# Patient Record
Sex: Male | Born: 1980 | Race: White | Hispanic: No | Marital: Married | Smoking: Never smoker
Health system: Southern US, Community
[De-identification: ages and names within clinical notes are randomized; demographics above are authoritative.]

## PROBLEM LIST (undated history)

## (undated) DIAGNOSIS — F4310 Post-traumatic stress disorder, unspecified: Secondary | ICD-10-CM

## (undated) DIAGNOSIS — Z87898 Personal history of other specified conditions: Secondary | ICD-10-CM

## (undated) DIAGNOSIS — G5600 Carpal tunnel syndrome, unspecified upper limb: Secondary | ICD-10-CM

## (undated) DIAGNOSIS — R569 Unspecified convulsions: Secondary | ICD-10-CM

## (undated) DIAGNOSIS — F329 Major depressive disorder, single episode, unspecified: Secondary | ICD-10-CM

## (undated) DIAGNOSIS — F32A Depression, unspecified: Secondary | ICD-10-CM

## (undated) DIAGNOSIS — S6990XA Unspecified injury of unspecified wrist, hand and finger(s), initial encounter: Secondary | ICD-10-CM

## (undated) DIAGNOSIS — F1021 Alcohol dependence, in remission: Secondary | ICD-10-CM

## (undated) DIAGNOSIS — F419 Anxiety disorder, unspecified: Secondary | ICD-10-CM

## (undated) DIAGNOSIS — Z8639 Personal history of other endocrine, nutritional and metabolic disease: Secondary | ICD-10-CM

## (undated) HISTORY — PX: CARPAL TUNNEL RELEASE: SHX101

## (undated) HISTORY — DX: Unspecified injury of unspecified wrist, hand and finger(s), initial encounter: S69.90XA

## (undated) HISTORY — DX: Carpal tunnel syndrome, unspecified upper limb: G56.00

## (undated) HISTORY — DX: Personal history of other specified conditions: Z87.898

## (undated) HISTORY — DX: Anxiety disorder, unspecified: F41.9

## (undated) HISTORY — DX: Alcohol dependence, in remission: F10.21

## (undated) HISTORY — DX: Personal history of other endocrine, nutritional and metabolic disease: Z86.39

---
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
## 2004-01-08 ENCOUNTER — Ambulatory Visit: Payer: Self-pay | Admitting: Internal Medicine

## 2005-08-02 ENCOUNTER — Emergency Department (HOSPITAL_COMMUNITY): Admission: EM | Admit: 2005-08-02 | Discharge: 2005-08-02 | Payer: Self-pay | Admitting: Emergency Medicine

## 2005-08-08 ENCOUNTER — Emergency Department (HOSPITAL_COMMUNITY): Admission: EM | Admit: 2005-08-08 | Discharge: 2005-08-08 | Payer: Self-pay | Admitting: Emergency Medicine

## 2005-09-18 ENCOUNTER — Emergency Department (HOSPITAL_COMMUNITY): Admission: EM | Admit: 2005-09-18 | Discharge: 2005-09-18 | Payer: Self-pay | Admitting: Emergency Medicine

## 2005-12-24 ENCOUNTER — Ambulatory Visit: Payer: Self-pay | Admitting: Family Medicine

## 2006-02-26 ENCOUNTER — Emergency Department (HOSPITAL_COMMUNITY): Admission: EM | Admit: 2006-02-26 | Discharge: 2006-02-27 | Payer: Self-pay | Admitting: Emergency Medicine

## 2007-11-23 ENCOUNTER — Ambulatory Visit: Payer: Self-pay | Admitting: Family Medicine

## 2007-11-23 DIAGNOSIS — F410 Panic disorder [episodic paroxysmal anxiety] without agoraphobia: Secondary | ICD-10-CM

## 2007-11-28 ENCOUNTER — Encounter: Payer: Self-pay | Admitting: Family Medicine

## 2007-11-28 DIAGNOSIS — F411 Generalized anxiety disorder: Secondary | ICD-10-CM | POA: Insufficient documentation

## 2007-11-28 DIAGNOSIS — R030 Elevated blood-pressure reading, without diagnosis of hypertension: Secondary | ICD-10-CM | POA: Insufficient documentation

## 2007-11-28 DIAGNOSIS — K219 Gastro-esophageal reflux disease without esophagitis: Secondary | ICD-10-CM

## 2007-11-30 ENCOUNTER — Telehealth (INDEPENDENT_AMBULATORY_CARE_PROVIDER_SITE_OTHER): Payer: Self-pay | Admitting: Internal Medicine

## 2007-12-07 ENCOUNTER — Ambulatory Visit: Payer: Self-pay | Admitting: Family Medicine

## 2007-12-08 LAB — CONVERTED CEMR LAB
ALT: 26 units/L (ref 0–53)
AST: 24 units/L (ref 0–37)
Albumin: 4.1 g/dL (ref 3.5–5.2)
Bilirubin, Direct: 0.1 mg/dL (ref 0.0–0.3)
Calcium: 9.4 mg/dL (ref 8.4–10.5)
Cholesterol: 146 mg/dL (ref 0–200)
Creatinine, Ser: 0.9 mg/dL (ref 0.4–1.5)
GFR calc Af Amer: 132 mL/min
GFR calc non Af Amer: 109 mL/min
Glucose, Bld: 96 mg/dL (ref 70–99)
LDL Cholesterol: 110 mg/dL — ABNORMAL HIGH (ref 0–99)
Lymphocytes Relative: 30.8 % (ref 12.0–46.0)
MCHC: 35.2 g/dL (ref 30.0–36.0)
MCV: 87.5 fL (ref 78.0–100.0)
Monocytes Absolute: 0.5 10*3/uL (ref 0.1–1.0)
Monocytes Relative: 8.5 % (ref 3.0–12.0)
Platelets: 196 10*3/uL (ref 150–400)
TSH: 1.91 microintl units/mL (ref 0.35–5.50)
Total CHOL/HDL Ratio: 5.7
Total Protein: 6.9 g/dL (ref 6.0–8.3)
VLDL: 10 mg/dL (ref 0–40)
WBC: 5.3 10*3/uL (ref 4.5–10.5)

## 2008-06-07 ENCOUNTER — Ambulatory Visit: Payer: Self-pay | Admitting: Family Medicine

## 2008-06-07 DIAGNOSIS — R062 Wheezing: Secondary | ICD-10-CM | POA: Insufficient documentation

## 2009-02-01 HISTORY — PX: GASTRIC BYPASS: SHX52

## 2009-05-01 ENCOUNTER — Encounter: Payer: Self-pay | Admitting: Family Medicine

## 2009-05-19 ENCOUNTER — Ambulatory Visit: Payer: Self-pay | Admitting: Family Medicine

## 2009-05-19 DIAGNOSIS — F17200 Nicotine dependence, unspecified, uncomplicated: Secondary | ICD-10-CM

## 2009-07-09 ENCOUNTER — Encounter: Payer: Self-pay | Admitting: Family Medicine

## 2009-07-14 ENCOUNTER — Encounter: Payer: Self-pay | Admitting: Family Medicine

## 2009-07-24 ENCOUNTER — Ambulatory Visit: Payer: Self-pay | Admitting: Family Medicine

## 2009-10-09 ENCOUNTER — Ambulatory Visit: Payer: Self-pay | Admitting: Family Medicine

## 2009-10-24 ENCOUNTER — Telehealth: Payer: Self-pay | Admitting: Family Medicine

## 2009-11-10 ENCOUNTER — Ambulatory Visit: Payer: Self-pay | Admitting: Family Medicine

## 2009-11-10 DIAGNOSIS — G4700 Insomnia, unspecified: Secondary | ICD-10-CM

## 2009-11-19 ENCOUNTER — Telehealth: Payer: Self-pay | Admitting: Family Medicine

## 2009-12-31 ENCOUNTER — Telehealth: Payer: Self-pay | Admitting: Family Medicine

## 2010-03-01 LAB — CONVERTED CEMR LAB
Alkaline Phosphatase: 91 units/L (ref 39–117)
BUN: 12 mg/dL (ref 6–23)
Basophils Absolute: 0 10*3/uL (ref 0.0–0.1)
CO2: 32 meq/L (ref 19–32)
Chloride: 103 meq/L (ref 96–112)
Cholesterol: 158 mg/dL (ref 0–200)
Eosinophils Relative: 3.9 % (ref 0.0–5.0)
GFR calc non Af Amer: 107.99 mL/min (ref >60)
Glucose, Bld: 86 mg/dL (ref 70–99)
HDL: 31.4 mg/dL — ABNORMAL LOW (ref >39.00)
Hemoglobin: 16.3 g/dL (ref 13.0–17.0)
MCHC: 35.6 g/dL (ref 30.0–36.0)
Monocytes Relative: 5.8 % (ref 3.0–12.0)
Neutrophils Relative %: 70.6 % (ref 43.0–77.0)
Potassium: 4.1 meq/L (ref 3.5–5.1)
RDW: 12.6 % (ref 11.5–14.6)
Sodium: 141 meq/L (ref 135–145)
Total Protein: 7.1 g/dL (ref 6.0–8.3)
Triglycerides: 92 mg/dL (ref 0.0–149.0)
VLDL: 18.4 mg/dL (ref 0.0–40.0)
WBC: 7.4 10*3/uL (ref 4.5–10.5)

## 2010-03-03 NOTE — Assessment & Plan Note (Signed)
Summary: HOSPITAL F/U FROM BARIATRIC SURGERY IN [facility]   Vital Signs:  Patient profile:   30 year old male Weight:      267 pounds Temp:     98.6 degrees F oral Pulse rate:   68 / minute Pulse rhythm:   regular BP sitting:   112 / 60  (left arm) Cuff size:   large  Vitals Entered By: Lowella Petties CMA (July 24, 2009 11:56 AM) CC: Hospital follow up   History of Present Illness: 30 yo here for follow up of bariatric surgery.  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 Has been doing very well.  Tolerated procedure well. Still in first stage of diet- protein shakes and pureed foods. Has lost 22 pounds since his last office viist on 05/19/2009. Emotionally adjusting well.  Does not feel like he is missing out on food, never feels hungry. Wife is very supportive. Has follow up labs and post op appointment scheduled with surgeon in July.  Tobacco abuse- quit smokeless tobacco cold [place] in April.  No cravings, doing well!!  Current Medications (verified): 1)  Prilosec 20 Mg Cpdr (Omeprazole) .... Take One By Mouth Two Times A Day 2)  Actical  Caps (Multiple Vitamins-Minerals) .... Take By Mouth As Directed 3)  Multi-Vitamin  Tabs (Multiple Vitamin) .... Take One By Mouth Three Times A Day 4)  Vitamin D3 1000 Unit Tabs (Cholecalciferol) .... 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 CV:  Denies chest pain or discomfort. Resp:  Denies shortness of breath. GI:  Denies abdominal pain and change in bowel habits. Psych:  Denies anxiety and depression.  Physical Exam  General:  alert, well-developed, well-nourished, and well-hydrated.   Lost 22 pounds! Lungs:  Normal respiratory effort, chest expands symmetrically. Lungs are clear to auscultation, no crackles or wheezes. Heart:  Normal rate and regular rhythm. S1 and S2 normal without gallop, murmur, click, rub or other extra sounds. Extremities:  No clubbing, cyanosis, edema, or deformity noted with normal full range of motion of all joints.   Psych:  Cognition and judgment appear intact. Alert and cooperative with normal attention span and concentration. No apparent delusions, illusions, hallucinations   Impression & Recommendations:  Problem # 1:  ELEVATED BLOOD PRESSURE WITHOUT DIAGNOSIS OF HYPERTENSION (ICD-796.2) Assessment Improved Much improved, normotensive.  Problem # 2:  MORBID OBESITY (ICD-278.01) Assessment: Improved Doing very well, s/p gastric bypass.  Will continue to follow along with his surgeon. Advised to stay as hydrated as possible.  Problem # 3:  SMOKELESS TOBACCO ABUSE (ICD-305.1) Assessment: Improved Quit cold [place] in April.  congratulated him on his success! The following medications were removed from the medication list:    Nicorette Starter Kit 2 Mg Gum (Nicotine polacrilex) ..... Use as directed.  do not chew tobacco  while using this gum.  Complete Medication List: 1)  Prilosec 20 Mg Cpdr (Omeprazole) .... Take one by mouth two times a day 2)  Actical Caps (Multiple vitamins-minerals) .... Take by mouth as directed 3)  Multi-vitamin Tabs (Multiple vitamin) ....  Take one by mouth three times a day 4)  Vitamin D3 1000 Unit Tabs (Cholecalciferol) .... Take two by mouth daily 5)  Fiber   Prior Medications: PRILOSEC 20 MG CPDR (OMEPRAZOLE) take one by mouth two times a day ACTICAL  CAPS (MULTIPLE VITAMINS-MINERALS) take by mouth as directed MULTI-VITAMIN  TABS (MULTIPLE VITAMIN) take one by mouth three times a day VITAMIN D3 1000 UNIT TABS (CHOLECALCIFEROL) take two by mouth daily Current Allergies (reviewed today): No known allergies

## 2010-03-03 NOTE — Progress Notes (Signed)
Summary: refill request for alprazolam  Phone Note Refill Request Call back at Home Phone [phone] Message from:  Patient  Refills Requested: Medication #1:  ALPRAZOLAM 0.5 MG  TABS 1 tab by mouth three times a day as needed anxiety or insomnia Please send to [facility].  Initial call taken by: Lowella Petties CMA,  November 19, 2009 10:17 AM  Follow-up for Phone Call        Rx called to pharmacy, patient notified via telephone.  Follow-up by: Linde Gillis CMA Duncan Dull),  November 19, 2009 10:42 AM    Prescriptions: ALPRAZOLAM 0.5 MG  TABS (ALPRAZOLAM) 1 tab by mouth three times a day as needed anxiety or insomnia  #60 x 0   Entered and Authorized by:   Ruthe Mannan MD   Signed by:   Ruthe Mannan MD on 11/19/2009   Method used:   Telephoned to ...       [facility]* (retail)       [address]       [place], [place]  [address]       Ph: [phone]       Fax: [phone]   RxID:   [id number]

## 2010-03-03 NOTE — Progress Notes (Signed)
Summary: refill request for xanax  Phone Note Refill Request Call back at Home Phone [phone] Message from:  Patient  Refills Requested: Medication #1:  ALPRAZOLAM 0.5 MG  TABS 1 tab by mouth three times a day as needed anxiety or insomnia Phoned request from pt, please send to [facility].  Initial call taken by: Lowella Petties CMA, AAMA,  December 31, 2009 3:44 PM  Follow-up for Phone Call        please call in.  Follow-up by: Crawford Givens MD,  December 31, 2009 9:12 PM  Additional Follow-up for Phone Call Additional follow up Details #1::        Rx called to pharmacy Additional Follow-up by: Linde Gillis CMA Duncan Dull),  January 01, 2010 8:00 AM    Prescriptions: ALPRAZOLAM 0.5 MG  TABS (ALPRAZOLAM) 1 tab by mouth three times a day as needed anxiety or insomnia  #60 x 0   Entered and Authorized by:   Crawford Givens MD   Signed by:   Crawford Givens MD on 12/31/2009   Method used:   Telephoned to ...       [facility]* (retail)       [address]       [place], [place]  [address]       Ph: [phone]       Fax: [phone]   RxID:   [id number]

## 2010-03-03 NOTE — Consult Note (Signed)
[facility]   Imported By: Lanelle Bal 05/06/2009 14:27:10  _____________________________________________________________________  External Attachment:    Type:   Image     Comment:   External Document

## 2010-03-03 NOTE — Progress Notes (Signed)
Summary: refill request for alprazolam  Phone Note Refill Request Call back at Home Phone [phone] Message from:  Patient  Refills Requested: Medication #1:  ALPRAZOLAM 0.5 MG  TABS 1 tab by mouth three times a day as needed anxiety or insomnia Please send to [facility], advised that Dr. Dayton Martes is out till monday, that's ok.  Initial call taken by: Lowella Petties CMA,  October 24, 2009 4:06 PM  Follow-up for Phone Call        Rx called to pharmacy Follow-up by: Linde Gillis CMA Duncan Dull),  October 27, 2009 8:06 AM    Prescriptions: ALPRAZOLAM 0.5 MG  TABS (ALPRAZOLAM) 1 tab by mouth three times a day as needed anxiety or insomnia  #60 x 0   Entered and Authorized by:   Ruthe Mannan MD   Signed by:   Ruthe Mannan MD on 10/24/2009   Method used:   Telephoned to ...       [facility]* (retail)       [address]       [place], [place]  [address]       Ph: [phone]       Fax: [phone]   RxID:   [id number]

## 2010-03-03 NOTE — Letter (Signed)
Summary: *Consult Note  [place] [facility]  [address] [place], [place] [address]   Phone: [phone]  Fax: [phone]    Re:    ZYMARION FAVORITE DOB:    01/31/1981   To whom it may concern:   Thank you for requesting that we see the above patient for consultation.  Evaluation today is consistent with:  1)  OTHER SPECIFIED PRE-OPERATIVE EXAMINATION (ICD-V72.83)  Melody Haver came to see me to discuss bariatric surgery.  He has already attended his presurgical appointments and has appointments scheduled for sleep study and mental health evaluation.   He has been overweight since childhood.  Has tried multiplle diets in past.  He has recently tried improving his diet which has helped his blood pressure but he continues to gain weight.  In November of last year, his HDL cholesterol was very low at 25.  I think he is a perfect candidate for this surgery.  He wants to be an active husband and father and needs help surgically to help with his weight loss goals   New Orders include:  1)  EKG w/ Interpretation [93000]   New Medications started today include:  1)  NICORETTE STARTER KIT 2 MG GUM (NICOTINE POLACRILEX) Use as directed.  Do not chew tobacco while using this gum.   After today's visit, the patients current medications include: 1)  NICORETTE STARTER KIT 2 MG GUM (NICOTINE POLACRILEX) Use as directed.  Do not chew tobacco while using this gum.   Thank you for this consultation.  If you have any further questions regarding the care of this patient, please do not hesitate to contact me @   Thank you for this opportunity to look after your patient.  Sincerely,   Ruthe Mannan MD

## 2010-03-03 NOTE — Assessment & Plan Note (Signed)
Summary: 1 month followup/rbh   Vital Signs:  Patient profile:   30 year old male Height:      69.25 inches Weight:      212.0 pounds BMI:     31.19 Temp:     98.7 degrees F oral Pulse rate:   72 / minute Pulse rhythm:   regular BP sitting:   118 / 72  (left arm) Cuff size:   regular  Vitals Entered By: Benny Lennert CMA Duncan Dull) (November 10, 2009 3:58 PM)  History of Present Illness: Chief complaint 1 month follow up anxiety/depression.  Found out last month tthat his wife is having an affair, she has moved out and he is having a difficult time coping.    Started Prozac 20 mg last month, Alprazolam as needed for panic attacks.  Does feel a little better, less anxious and feels like he can cope with his current situation but he is obviously still sad.  Can't fall asleep.  Does not want to take too much Alprazolam.  Once he is asleep, he does stay asleep.  Starting court proceedings next month for divorce and custody of his son.  Has decreaed appetite but he is s/p bariatric surgery and most of his weight loss has been intentional.  Current Medications (verified): 1)  Prilosec 20 Mg Cpdr (Omeprazole) .... Take One By Mouth Two Times A Day 2)  Actical  Caps (Multiple Vitamins-Minerals) .... Take By Mouth As Directed 3)  Multi-Vitamin  Tabs (Multiple Vitamin) .... Take One By Mouth Three Times A Day 4)  Vitamin D3 1000 Unit Tabs (Cholecalciferol) .... Take Two By Mouth Daily 5)  Fiber 6)  Alprazolam 0.5 Mg  Tabs (Alprazolam) .Marland Kitchen.. 1 Tab By Mouth Three Times A Day As Needed Anxiety or Insomnia 7)  Prozac 20 Mg Caps (Fluoxetine Hcl) .Marland Kitchen.. 1 Tab By Mouth Daily. 8)  Ambien 5 Mg Tabs (Zolpidem Tartrate) .Marland Kitchen.. 1 Tab By Mouth At Bedtime As Needed Insomnia  Allergies (verified): No Known Drug Allergies  Review of Systems      See HPI General:  Complains of loss of appetite. Psych:  Complains of anxiety, depression, and easily tearful; denies panic attacks, sense of great danger,  suicidal thoughts/plans, thoughts of violence, unusual visions or sounds, and thoughts /plans of harming others.   Impression & Recommendations:  Problem # 1:  ANXIETY (ICD-300.00) Assessment Improved Continue Prozac 20 mg daily and Alprazolam as needed.  His updated medication list for this problem includes:    Alprazolam 0.5 Mg Tabs (Alprazolam) .Marland Kitchen... 1 tab by mouth three times a day as needed anxiety or insomnia    Prozac 20 Mg Caps (Fluoxetine hcl) .Marland Kitchen... 1 tab by mouth daily.  Problem # 2:  INSOMNIA (ICD-780.52) Assessment: Deteriorated related to #1.  Will try short course of Ambien. His updated medication list for this problem includes:    Ambien 5 Mg Tabs (Zolpidem tartrate) .Marland Kitchen... 1 tab by mouth at bedtime as needed insomnia  Complete Medication List: 1)  Prilosec 20 Mg Cpdr (Omeprazole) .... Take one by mouth two times a day 2)  Actical Caps (Multiple vitamins-minerals) .... Take by mouth as directed 3)  Multi-vitamin Tabs (Multiple vitamin) .... Take one by mouth three times a day 4)  Vitamin D3 1000 Unit Tabs (Cholecalciferol) .... Take two by mouth daily 5)  Fiber  6)  Alprazolam 0.5 Mg Tabs (Alprazolam) .Marland Kitchen.. 1 tab by mouth three times a day as needed anxiety or insomnia 7)  Prozac 20 Mg  Caps (Fluoxetine hcl) .Marland Kitchen.. 1 tab by mouth daily. 8)  Ambien 5 Mg Tabs (Zolpidem tartrate) .Marland Kitchen.. 1 tab by mouth at bedtime as needed insomnia Prescriptions: AMBIEN 5 MG TABS (ZOLPIDEM TARTRATE) 1 tab by mouth at bedtime as needed insomnia  #30 x 0   Entered and Authorized by:   Ruthe Mannan MD   Signed by:   Ruthe Mannan MD on 11/10/2009   Method used:   Print then Give to Patient   RxID:   [id number]   Current Allergies (reviewed today): No known allergies

## 2010-03-03 NOTE — Assessment & Plan Note (Signed)
Summary: ANXIETY & DEPRESSION ISSUES / LFW   Vital Signs:  Patient profile:   30 year old male Height:      69.25 inches Weight:      221 pounds BMI:     32.52 Temp:     98.9 degrees F oral Pulse rate:   72 / minute Pulse rhythm:   regular BP sitting:   130 / 90  (left arm) Cuff size:   regular  Vitals Entered By: Linde Gillis CMA Duncan Dull) (October 09, 2009 12:31 PM) CC: anxiety and depression issues   History of Present Illness: 30 yo here to discuss anxiety and depression.  Found out two weeks ago that his wife is having an affair, she has moved out and he is having a difficult time coping.  Has a h/o panic attacks, has not had one in the last two weeks. Does get anxious when he thinks about the future, what divorce will do to his small son. Angry, tearful at times, difficulty sleeping. No SI or HI. Does not want counselling, has been talking with his pastor at work.  Current Medications (verified): 1)  Prilosec 20 Mg Cpdr (Omeprazole) .... Take One By Mouth Two Times A Day 2)  Actical  Caps (Multiple Vitamins-Minerals) .... Take By Mouth As Directed 3)  Multi-Vitamin  Tabs (Multiple Vitamin) .... Take One By Mouth Three Times A Day 4)  Vitamin D3 1000 Unit Tabs (Cholecalciferol) .... Take Two By Mouth Daily 5)  Fiber 6)  Alprazolam 0.5 Mg  Tabs (Alprazolam) .Marland Kitchen.. 1 Tab By Mouth Three Times A Day As Needed Anxiety or Insomnia 7)  Prozac 20 Mg Caps (Fluoxetine Hcl) .Marland Kitchen.. 1 Tab By Mouth Daily.  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  Physical Exam  General:  alert, well-developed, well-nourished, and well-hydrated.    Psych:  Cognition and judgment appear intact. Alert and cooperative with normal attention span and concentration. No apparent delusions, illusions, hallucinations   Impression & Recommendations:  Problem # 1:  ANXIETY (ICD-300.00) Assessment Deteriorated Acute stress reaction. Time spent with patient 25 minutes, more than 50% of this time was spent counseling patient on anxiety and depression. Will start Prozac 20 mg daily, will Alprazolam as needed. Pt to follow up in one month.  His updated medication list for this problem includes:    Alprazolam 0.5 Mg Tabs (Alprazolam) .Marland Kitchen... 1 tab by mouth three times a day as needed anxiety or insomnia    Prozac 20 Mg Caps (Fluoxetine hcl) .Marland Kitchen... 1 tab by mouth daily.  Complete Medication List: 1)  Prilosec 20 Mg Cpdr (Omeprazole) .... Take one by mouth two times a day 2)  Actical  Caps (Multiple vitamins-minerals) .... Take by mouth as directed 3)  Multi-vitamin Tabs (Multiple vitamin) .... Take one by mouth three times a day 4)  Vitamin D3 1000 Unit Tabs (Cholecalciferol) .... Take two by mouth daily 5)  Fiber  6)  Alprazolam 0.5 Mg Tabs (Alprazolam) .Marland Kitchen.. 1 tab by mouth three times a day as needed anxiety or insomnia 7)  Prozac 20 Mg Caps (Fluoxetine hcl) .Marland Kitchen.. 1 tab by mouth daily. Prescriptions: PROZAC 20 MG  CAPS (FLUOXETINE HCL) 1 tab by mouth daily.  #30 x 3   Entered and Authorized by:   Ruthe Mannan MD   Signed by:   Ruthe Mannan MD on 10/09/2009   Method used:   Electronically to        [facility]* (retail)       [address]       [place], [place]  [address]       Ph: [phone]       Fax: [phone]   RxID:   [id number] ALPRAZOLAM 0.5 MG  TABS (ALPRAZOLAM) 1 tab by mouth three times a day as needed anxiety or insomnia  #60 x 0   Entered and Authorized by:   Ruthe Mannan MD   Signed by:   Ruthe Mannan MD on 10/09/2009   Method used:   Print then Give to Patient   RxID:   [id number]   Current Allergies (reviewed today): No known allergies

## 2010-03-03 NOTE — Op Note (Signed)
Summary: [facility]   Imported By: Lanelle Bal 07/21/2009 10:02:54  _____________________________________________________________________  External Attachment:    Type:   Image     Comment:   External Document

## 2010-03-03 NOTE — Letter (Signed)
Summary: [facility]   Imported By: Lanelle Bal 07/21/2009 10:03:57  _____________________________________________________________________  External Attachment:    Type:   Image     Comment:   External Document

## 2010-03-03 NOTE — Assessment & Plan Note (Signed)
Summary: 30 MIN DISCUSS BARIATRIC SURGERY/CLE   Vital Signs:  Patient profile:   30 year old male Height:      69.25 inches Weight:      289.25 pounds BMI:     42.56 Temp:     98.7 degrees F oral Pulse rate:   80 / minute Pulse rhythm:   regular BP sitting:   120 / 78  (left arm) Cuff size:   large  Vitals Entered By: Lewanda Rife LPN (30 year old male Height: discuss bariatric surgery   History of Present Illness: 30 yo patient new to me here to discuss bariatric surgery.  Has already started the process, seeing Dr. Adolphus Birchwood at [facility]. Has attended intro meetings for sleeve surgery. Has appt scheduled for 4/21 for sleep study, with nutritionist on 5/12 and mental health as well.  Has been overweight his entire life, tried every fad diet he could think of. 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 Recently had a little son, tried to change diet for him, less fast food, but continues to gain weight.  BP has improved since he made recently healthy dietary changes.  He does not smoke cigarettes but does chew tobacco.  Has tried cold [place] and regulary chewing gum, tooth picks with no success.  Current Medications (verified): 1)  Nicorette Starter Kit 2 Mg Gum (Nicotine Polacrilex) .... Use As Directed.  Do Not Chew Tobacco While Using This Gum.  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 Eyes:  Denies blurring. ENT:  Denies difficulty swallowing. CV:  Denies chest pain or discomfort. Resp:  Denies shortness of breath.  Physical Exam  General:  alert, well-developed, well-nourished, and well-hydrated.   Eyes:  vision grossly intact, pupils equal, and pupils round.   Ears:  R ear  normal and L ear normal.   Mouth:  Oral mucosa and oropharynx without lesions or exudates.  Teeth in good repair. Lungs:  Normal respiratory effort, chest expands symmetrically. Lungs are clear to auscultation, no crackles or wheezes. Heart:  Normal rate and regular rhythm. S1 and S2 normal without gallop, murmur, click, rub or other extra sounds. Psych:  normally interactive and good eye contact.     Impression & Recommendations:  Problem # 1:  OTHER SPECIFIED PRE-OPERATIVE EXAMINATION (ICD-V72.83) EKG normal sinus rhythm. Letter written stating that we support his decision to have this surgery. TSH, CBC, Vit D, CMET, lipid panel drawn today per request of bariatric center for surgical clearance. Orders: EKG w/ Interpretation (93000) Venipuncture ([address]) TLB-Lipid Panel (80061-LIPID) TLB-BMP (Basic Metabolic Panel-BMET) (80048-METABOL) TLB-Hepatic/Liver Function Pnl (80076-HEPATIC) TLB-TSH (Thyroid Stimulating Hormone) (84443-TSH) T-Vitamin D (25-Hydroxy) ([address]-09811) TLB-CBC Platelet - w/Differential (85025-CBCD)  Problem # 2:  SMOKELESS TOBACCO ABUSE (ICD-305.1) Assessment: New  He would like to try Nicorette gum.  Discussed proper usage with him.  Although not always used for smokeless tobacco, may provide some benefit for him. His updated medication list for this problem includes:    Nicorette Starter Kit 2 Mg Gum (Nicotine polacrilex) ..... Use as directed.  do not chew tobacco while using this gum.  Orders: Tobacco use cessation intermediate 3-10 minutes (99406)  Complete Medication List: 1)  Nicorette Starter Kit 2 Mg Gum (Nicotine polacrilex) .... Use  as directed.  do not chew tobacco while using this gum. Prescriptions: NICORETTE STARTER KIT 2 MG GUM (NICOTINE POLACRILEX) Use as directed.  Do not chew tobacco while using this gum.  #1 x 0   Entered and Authorized by:   Ruthe Mannan MD   Signed by:   Ruthe Mannan MD on 05/19/2009   Method used:   Electronically to         [facility]* (retail)       [address]       [place], [place]  [address]       Ph: [phone]       Fax: [phone]   RxID:   [id number]   Prior Medications (reviewed today): None Current Allergies (reviewed today): No known allergies   Appended Document: Orders Update    Clinical Lists Changes  Orders: Added new Service order of Specimen Handling (84132) - Signed Added new Test order of T-Vitamin D (25-Hydroxy) (44010-27253) - Signed

## 2010-03-12 ENCOUNTER — Encounter: Payer: Self-pay | Admitting: Family Medicine

## 2010-03-12 ENCOUNTER — Ambulatory Visit (INDEPENDENT_AMBULATORY_CARE_PROVIDER_SITE_OTHER): Payer: 59 | Admitting: Family Medicine

## 2010-03-12 DIAGNOSIS — Z202 Contact with and (suspected) exposure to infections with a predominantly sexual mode of transmission: Secondary | ICD-10-CM

## 2010-03-14 LAB — CONVERTED CEMR LAB
HCV Ab: NEGATIVE
HIV: NONREACTIVE

## 2010-03-19 NOTE — Assessment & Plan Note (Signed)
Summary: PERSONAL/CLE   [facility]   Vital Signs:  Patient profile:   30 year old male Weight:      195.50 pounds Temp:     98.5 degrees F oral Pulse rate:   72 / minute Pulse rhythm:   regular BP sitting:   112 / 64  (left arm) Cuff size:   regular  Vitals Entered By: Selena Batten Dance CMA Duncan Dull) (March 12, 2010 4:03 PM) CC: Possible exposure to chlamydia   History of Present Illness: CC: ? chlamydia exposure  split with wife 6-7 mo ago 2/2 her having affair.  wife called today to tell him that her GYN dx her with chlamydia infection.  Pt without current sxs.  ? slight burning with voiding.  No discharge, fevers/chills, abd pain.  No new rashes.  No new exposures.  Last voided 1 hour ago.  would prefer to be treated, h/o anxiety.  Current Medications (verified): 1)  Prilosec 20 Mg Cpdr (Omeprazole) .... Take One By Mouth Two Times A Day 2)  Actical  Caps (Multiple Vitamins-Minerals) .... Take By Mouth As Directed 3)  Multi-Vitamin  Tabs (Multiple Vitamin) .... Take One By Mouth Three Times A Day 4)  Vitamin D3 1000 Unit Tabs (Cholecalciferol) .... Take Two By Mouth Daily 5)  Fiber  Allergies (verified): No Known Drug Allergies  Past History:  Past Medical History: Last updated: 11/28/2007 Anxiety:(11/23/2007)  Social History: Marital Status: going through divorce Children: 1 son Occupation:[employer]--works off duty as security--60h/wk  wife not working--attending community college in criminal justice  Review of Systems       per HPI  Physical Exam  General:  alert, well-developed, well-nourished, and well-hydrated.    Abdomen:  Bowel sounds positive,abdomen soft and non-tender without masses, organomegaly or hernias noted. Pulses:  2+ rad uplses Extremities:  No clubbing, cyanosis, edema, or deformity noted with normal full range of motion of all joints.   Skin:  Intact without suspicious lesions or rashes   Impression & Recommendations:  Problem # 1:   CONTACT WITH OR EXPOSURE TO VENEREAL DISEASES (ICD-V01.6) discussed treatment vs screening.  pt desires treatment - GC with CTX 250mg , CT with azithro 1gm. test for HIV, RPR, Hepatitis.  update with results.  Orders: Venipuncture (29562) T-Hepatitis Acute Panel ([id number]) T-HIV Antibody  (Reflex) [id number]) T-RPR (Syphilis) (24401-02725)  Complete Medication List: 1)  Prilosec 20 Mg Cpdr (Omeprazole) .... Take one by mouth two times a day 2)  Actical Caps (Multiple vitamins-minerals) .... Take by mouth as directed 3)  Multi-vitamin Tabs (Multiple vitamin) .... Take one by mouth three times a day 4)  Vitamin D3 1000 Unit Tabs (Cholecalciferol) .... Take two by mouth daily 5)  Fiber  6)  Zithromax 1 Gm Pack (Azithromycin) .... Take x 1  Patient Instructions: 1)  screen today. 2)  treat for chlamydia and gonorrhea today. 3)  shot of ceftriaxone today, azithromycin 1gm to pharmacy. 4)  call us with questions. Prescriptions: ZITHROMAX 1 GM PACK (AZITHROMYCIN) take x 1  #1 x 0   Entered and Authorized by:   Eustaquio Boyden  MD   Signed by:   Eustaquio Boyden  MD on 03/12/2010   Method used:   Electronically to        [facility]* (retail)       [address]       [place], [place]  [address]       Ph: [phone]       Fax: [phone]   RxID:  [id number]    Orders Added: 1)  Venipuncture [36415] 2)  T-Hepatitis Acute Panel [[id number]] 3)  T-HIV Antibody  (Reflex) [[id number]] 4)  T-RPR (Syphilis) [[id number]] 5)  Est. Patient Level III [62952]    Current Allergies (reviewed today): No known allergies   Appended Document: PERSONAL/CLE   [facility]  Late entry for medication administration at yesterday's visit. Kim Dance CMA Duncan Dull)  March 13, 2010 8:28 AM   Clinical Lists Changes  Orders: Added new Service order of Rocephin  250mg  (W4132) - Signed Added new Service order of Admin of Therapeutic Inj  intramuscular or subcutaneous (44010) -  Signed       Medication Administration  Injection # 1:    Medication: Rocephin  250mg     Diagnosis: CONTACT WITH OR EXPOSURE TO VENEREAL DISEASES (ICD-V01.6)    Route: IM    Site: RUOQ gluteus    Exp Date: 07/01/2012    Lot #: [id number]    Mfr: Sandoz    Comments: 250 mg per Dr. Sharen Hones    Patient tolerated injection without complications    Given by: Selena Batten Dance CMA Duncan Dull) (March 12, 2010)  Orders Added: 1)  Rocephin  250mg  [J0696] 2)  Admin of Therapeutic Inj  intramuscular or subcutaneous [64403]

## 2010-07-31 ENCOUNTER — Telehealth: Payer: Self-pay | Admitting: *Deleted

## 2010-07-31 MED ORDER — BENZONATATE 100 MG PO CAPS: 100.0000 mg | ORAL_CAPSULE | Freq: Four times a day (QID) | ORAL | Status: DC | PRN

## 2010-07-31 NOTE — Telephone Encounter (Signed)
Will send Tessalon Perles to [facility].

## 2010-07-31 NOTE — Telephone Encounter (Signed)
Rx called to pharmacy, patient advised as instructed via telephone.

## 2010-07-31 NOTE — Telephone Encounter (Signed)
Patient called to see if Dr. Dayton Martes would call him in a cough medication to [facility].  He stated that he has been coughing for some time now and nothing OTC will help his cough.  He stated that his son was sick with bronchitis and an ear infection and he just wants something to help him stop coughing and be able to sleep at night.

## 2010-09-30 ENCOUNTER — Ambulatory Visit (INDEPENDENT_AMBULATORY_CARE_PROVIDER_SITE_OTHER): Payer: 59 | Admitting: Family Medicine

## 2010-09-30 ENCOUNTER — Encounter: Payer: Self-pay | Admitting: Family Medicine

## 2010-09-30 DIAGNOSIS — J069 Acute upper respiratory infection, unspecified: Secondary | ICD-10-CM

## 2010-09-30 MED ORDER — AMOXICILLIN 500 MG PO CAPS: ORAL_CAPSULE | ORAL | Status: DC

## 2010-09-30 NOTE — Patient Instructions (Signed)
Stop Sudafed. Take Guaifenesin (400mg ), take 11/2 tabs by mouth AM and NOON. Get GUAIFENESIN by  [facility] to [facility] or [facility] and getting MUCOUS RELIEF EXPECTORANT/CONGESTION. DO NOT GET MUCINEX (Timed Release Guaifenesin)  Drink lots of fluids. Gargle with 30ccs of warm salt water every half hour for 2 days as able. Take Tylenol/Acetaminophen ES (500mg ) 2 tabs by mouth three times a day max as needed. If symptoms worsen in 48 hrs or more, take Amox.

## 2010-09-30 NOTE — Assessment & Plan Note (Signed)
Appears viral without focus of infection. See instructions.

## 2010-09-30 NOTE — Progress Notes (Signed)
  Subjective:    Patient ID: Randy Villa, male    DOB: 11/11/1980, 30 y.o.   MRN: [id number]  HPI Pt iof Dr Elmer Sow here as acute appt for sxs started Sun noight with chills that night with a little fever 100.1. Monday he felt fine and then Mon night developed headache with fever to 103, congestion and some ST. He has had exposure to 21 month old who had sxs for one day of fever to 101 and couldn't sleep with some nausea and then resolved by the next day. He has headache bitemporally, he has fever of 101.9 here, no ear pain, no rhinitis, some mild ST that started today, no cough, no N/V, mild diarrhea twice, last last night, no unusual food intake.  He has taken Sudafed, taking regularly every 8 hrs since yesterday.     Review of SystemsNoncontributory except as above.       Objective:   Physical Exam  Constitutional: He appears well-developed and well-nourished. No distress.  HENT:  Head: Normocephalic and atraumatic.  Right Ear: External ear normal.  Left Ear: External ear normal.  Nose: Nose normal.  Mouth/Throat: Oropharynx is clear and moist.  Eyes: Conjunctivae and EOM are normal. Pupils are equal, round, and reactive to light. Right eye exhibits no discharge. Left eye exhibits no discharge.  Neck: Normal range of motion. Neck supple.  Cardiovascular: Normal rate and regular rhythm.   Pulmonary/Chest: Effort normal and breath sounds normal. He has no wheezes.  Abdominal: Soft. Bowel sounds are normal. He exhibits no distension and no mass. There is no tenderness. There is no rebound.  Lymphadenopathy:    He has no cervical adenopathy.  Skin: He is not diaphoretic.          Assessment & Plan:

## 2011-01-22 ENCOUNTER — Encounter: Payer: Self-pay | Admitting: Family Medicine

## 2011-01-22 ENCOUNTER — Ambulatory Visit (INDEPENDENT_AMBULATORY_CARE_PROVIDER_SITE_OTHER): Payer: 59 | Admitting: Family Medicine

## 2011-01-22 DIAGNOSIS — J069 Acute upper respiratory infection, unspecified: Secondary | ICD-10-CM

## 2011-01-22 MED ORDER — BENZONATATE 200 MG PO CAPS: 200.0000 mg | ORAL_CAPSULE | Freq: Three times a day (TID) | ORAL | Status: AC | PRN

## 2011-01-22 MED ORDER — HYDROCODONE-HOMATROPINE 5-1.5 MG/5ML PO SYRP: 5.0000 mL | ORAL_SOLUTION | Freq: Three times a day (TID) | ORAL | Status: AC | PRN

## 2011-01-22 NOTE — Progress Notes (Signed)
Occ snuff.  "I think I have bronchitis."  Son is sick at home.  Pt is stuffy for last month.  Recent changes in last 2-3 days.  Coughing for last few days.  Chest is tight, voice change noted, no fevers.  Some sputum- green.  No wheeze.  ST.  Taking some cough medicine w/o much help.  Using vicks and taking advil and throat spray.     Meds, vitals, and allergies reviewed.   ROS: See HPI.  Otherwise, noncontributory.   GEN: nad, alert and oriented HEENT: mucous membranes moist, tm w/o erythema, nasal exam w/o erythema, clear discharge noted,  OP with cobblestoning NECK: supple w/o LA CV: rrr.   PULM: ctab, no inc wob EXT: no edema SKIN: no acute rash Sinuses not ttp

## 2011-01-22 NOTE — Patient Instructions (Signed)
Drink plenty of fluids, take tylenol as needed, and gargle with warm salt water for your throat.  Take the tessalon for the cough and use the hycodan if that doesn't help.  Hycodan can make you drowsy.  Don't take before/during work.  This should gradually improve.  Take care.  Let us know if you have other concerns.

## 2011-01-24 ENCOUNTER — Encounter: Payer: Self-pay | Admitting: Family Medicine

## 2011-01-24 NOTE — Assessment & Plan Note (Signed)
Likely viral, nontoxic, lungs completely ctab and nontoxic, f/u prn.  Supportive tx.  He agrees.  ddx d/w pt.

## 2011-03-12 ENCOUNTER — Other Ambulatory Visit: Payer: Self-pay | Admitting: *Deleted

## 2011-03-12 MED ORDER — OMEPRAZOLE 20 MG PO CPDR: 20.0000 mg | DELAYED_RELEASE_CAPSULE | Freq: Every day | ORAL | Status: DC

## 2011-04-30 ENCOUNTER — Emergency Department (INDEPENDENT_AMBULATORY_CARE_PROVIDER_SITE_OTHER): Payer: Worker's Compensation

## 2011-04-30 ENCOUNTER — Encounter (HOSPITAL_BASED_OUTPATIENT_CLINIC_OR_DEPARTMENT_OTHER): Payer: Self-pay | Admitting: *Deleted

## 2011-04-30 ENCOUNTER — Emergency Department (HOSPITAL_BASED_OUTPATIENT_CLINIC_OR_DEPARTMENT_OTHER)
Admission: EM | Admit: 2011-04-30 | Discharge: 2011-04-30 | Disposition: A | Discharge status: Home / Self Care | Payer: Worker's Compensation | Attending: Emergency Medicine | Admitting: Emergency Medicine

## 2011-04-30 DIAGNOSIS — S63509A Unspecified sprain of unspecified wrist, initial encounter: Secondary | ICD-10-CM | POA: Insufficient documentation

## 2011-04-30 DIAGNOSIS — W19XXXA Unspecified fall, initial encounter: Secondary | ICD-10-CM

## 2011-04-30 DIAGNOSIS — M25539 Pain in unspecified wrist: Secondary | ICD-10-CM

## 2011-04-30 DIAGNOSIS — S60219A Contusion of unspecified wrist, initial encounter: Secondary | ICD-10-CM | POA: Insufficient documentation

## 2011-04-30 DIAGNOSIS — S63501A Unspecified sprain of right wrist, initial encounter: Secondary | ICD-10-CM

## 2011-04-30 DIAGNOSIS — IMO0002 Reserved for concepts with insufficient information to code with codable children: Secondary | ICD-10-CM | POA: Insufficient documentation

## 2011-04-30 DIAGNOSIS — S60211A Contusion of right wrist, initial encounter: Secondary | ICD-10-CM

## 2011-04-30 DIAGNOSIS — Y9269 Other specified industrial and construction area as the place of occurrence of the external cause: Secondary | ICD-10-CM | POA: Insufficient documentation

## 2011-04-30 DIAGNOSIS — Y990 Civilian activity done for income or pay: Secondary | ICD-10-CM | POA: Insufficient documentation

## 2011-04-30 MED ORDER — NAPROXEN 500 MG PO TABS: 500.0000 mg | ORAL_TABLET | Freq: Two times a day (BID) | ORAL | Status: AC
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
## 2011-04-30 NOTE — ED Notes (Signed)
Patient transported to X-ray 
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
## 2011-04-30 NOTE — Discharge Instructions (Signed)
Your xrays show no signs of fracture.  This means that your injuries should heal over the next 7-10 days.  You can expect the wrist to be tender and swollen - use Ice as described - naprosyn twice daily for pain and keep the wrist elevated when resting.  The splint will help reduce the pain by limiting motion - you may take this off as needed and over the week, wear it less and less.  If you are still having pain in 1 week, you may need to have this re-xray'd as occasionall a small fracture goes unseen on the initial xray and is only seen when the body starts to repair the fracture and new bone is seen on the repeat xray.

## 2012-12-11 ENCOUNTER — Encounter (HOSPITAL_BASED_OUTPATIENT_CLINIC_OR_DEPARTMENT_OTHER): Payer: Self-pay | Admitting: Emergency Medicine

## 2012-12-11 ENCOUNTER — Emergency Department (HOSPITAL_BASED_OUTPATIENT_CLINIC_OR_DEPARTMENT_OTHER)
Admission: EM | Admit: 2012-12-11 | Discharge: 2012-12-11 | Disposition: A | Discharge status: Home / Self Care | Payer: 59 | Attending: Emergency Medicine | Admitting: Emergency Medicine

## 2012-12-11 DIAGNOSIS — J3489 Other specified disorders of nose and nasal sinuses: Secondary | ICD-10-CM | POA: Insufficient documentation

## 2012-12-11 DIAGNOSIS — R1011 Right upper quadrant pain: Secondary | ICD-10-CM | POA: Insufficient documentation

## 2012-12-11 DIAGNOSIS — IMO0001 Reserved for inherently not codable concepts without codable children: Secondary | ICD-10-CM | POA: Insufficient documentation

## 2012-12-11 DIAGNOSIS — R197 Diarrhea, unspecified: Secondary | ICD-10-CM | POA: Diagnosis present

## 2012-12-11 DIAGNOSIS — R109 Unspecified abdominal pain: Secondary | ICD-10-CM | POA: Diagnosis present

## 2012-12-11 DIAGNOSIS — R059 Cough, unspecified: Secondary | ICD-10-CM | POA: Diagnosis present

## 2012-12-11 DIAGNOSIS — R05 Cough: Secondary | ICD-10-CM | POA: Diagnosis present

## 2012-12-11 DIAGNOSIS — R1012 Left upper quadrant pain: Secondary | ICD-10-CM | POA: Insufficient documentation

## 2012-12-11 DIAGNOSIS — Z8659 Personal history of other mental and behavioral disorders: Secondary | ICD-10-CM | POA: Insufficient documentation

## 2012-12-11 LAB — CBC WITH DIFFERENTIAL/PLATELET
Basophils Absolute: 0 10*3/uL (ref 0.0–0.1)
Basophils Relative: 0 % (ref 0–1)
Eosinophils Absolute: 0.2 10*3/uL (ref 0.0–0.7)
HCT: 40.2 % (ref 39.0–52.0)
Hemoglobin: 14 g/dL (ref 13.0–17.0)
Lymphocytes Relative: 23 % (ref 12–46)
Lymphs Abs: 1.1 10*3/uL (ref 0.7–4.0)
MCHC: 34.8 g/dL (ref 30.0–36.0)
MCV: 86.1 fL (ref 78.0–100.0)
Monocytes Absolute: 0.7 10*3/uL (ref 0.1–1.0)
Monocytes Relative: 14 % — ABNORMAL HIGH (ref 3–12)
Neutro Abs: 2.9 10*3/uL (ref 1.7–7.7)
Platelets: 162 10*3/uL (ref 150–400)
WBC: 5 10*3/uL (ref 4.0–10.5)

## 2012-12-11 LAB — COMPREHENSIVE METABOLIC PANEL
ALT: 17 U/L (ref 0–53)
AST: 25 U/L (ref 0–37)
Albumin: 3.6 g/dL (ref 3.5–5.2)
BUN: 7 mg/dL (ref 6–23)
Calcium: 8.7 mg/dL (ref 8.4–10.5)
Creatinine, Ser: 0.8 mg/dL (ref 0.50–1.35)
GFR calc non Af Amer: >90 mL/min (ref >90)
Potassium: 3.6 mEq/L (ref 3.5–5.1)

## 2012-12-11 LAB — LIPASE, BLOOD: Lipase: 27 U/L (ref 11–59)

## 2012-12-11 MED ORDER — SODIUM CHLORIDE 0.9 % IV BOLUS (SEPSIS)
1000.0000 mL | INTRAVENOUS | Status: AC
  Administered 2012-12-11: 1000 mL via INTRAVENOUS

## 2012-12-11 MED ORDER — OXYCODONE-ACETAMINOPHEN 5-325 MG PO TABS: 1.0000 | ORAL_TABLET | ORAL | Status: DC | PRN

## 2012-12-11 MED ORDER — GI COCKTAIL ~~LOC~~
30.0000 mL | Freq: Once | ORAL | Status: AC
  Administered 2012-12-11: 30 mL via ORAL
  Filled 2012-12-11: qty 30

## 2012-12-11 MED ORDER — HYDROCOD POLST-CHLORPHEN POLST 10-8 MG/5ML PO LQCR: 5.0000 mL | Freq: Every evening | ORAL | Status: DC | PRN

## 2012-12-11 MED ORDER — OXYCODONE-ACETAMINOPHEN 5-325 MG PO TABS
1.0000 | ORAL_TABLET | Freq: Once | ORAL | Status: AC
  Administered 2012-12-11: 1 via ORAL
  Filled 2012-12-11: qty 1

## 2012-12-11 MED ORDER — BENZONATATE 100 MG PO CAPS: 100.0000 mg | ORAL_CAPSULE | Freq: Two times a day (BID) | ORAL | Status: DC | PRN

## 2012-12-11 NOTE — ED Notes (Signed)
Epigastric and abd pain   Also URI sx

## 2012-12-11 NOTE — ED Provider Notes (Signed)
CSN: [id number]     Arrival date & time 12/11/12  1948 History  This chart was scribed for Junius Argyle, MD by Blanchard Kelch, ED Scribe. The patient was seen in room MH04/MH04. Patient's care was started at 8:50 PM.    Chief Complaint  Patient presents with  . Abdominal Pain   Patient is a 32 y.o. male presenting with abdominal pain. The history is provided by the patient and the spouse. No language interpreter was used.  Abdominal Pain Pain location:  RUQ and LUQ Pain quality comment:  Sore Pain severity:  Moderate Duration:  2 days Timing:  Constant Progression:  Waxing and waning Chronicity:  New Worsened by:  Bowel movements and coughing (wearing tight vest for work) Associated symptoms: cough and diarrhea   Associated symptoms: no fever, no hematuria, no nausea and no vomiting     HPI Comments: Randy Villa is a 32 y.o. male who presents to the Emergency Department complaining of waxing and waning mid abdominal pain that began yesterday. He describes the pain as a soreness compared to the feeling of after working out. He was wearing a vest for his job working for [employer], which worsened the pain. The pain is also worsened by bearing down and coughing. The pain is currently a 4-5/10. He also had associated diarrhea today with about five episodes. He characterizes it as semi-solid but runny. He has recently had a URI that has not yet subsided with associated symptoms of rhinorrhea and myalgias. He started having a dry cough yesterday. His wife also states that his palms have been yellow. He has been able to eat and drink normally. He denies fever or vomiting. He has a past surgical history of a gastric bypass about three years ago. He denies smoking but drinks occasionally. He is a smokeless tobacco user.   Past Medical History  Diagnosis Date  . Anxiety    Past Surgical History  Procedure Laterality Date  . Gastric bypass     No family history on file. History   Substance Use Topics  . Smoking status: Never Smoker   . Smokeless tobacco: Current User    Types: Snuff  . Alcohol Use: Yes     Comment: rarely    Review of Systems  Constitutional: Negative for fever and appetite change.  HENT: Positive for rhinorrhea. Negative for drooling.   Eyes: Negative for discharge.  Respiratory: Positive for cough.   Cardiovascular: Negative for leg swelling.  Gastrointestinal: Positive for abdominal pain and diarrhea. Negative for nausea and vomiting.  Endocrine: Negative for polyuria.  Genitourinary: Negative for hematuria.  Musculoskeletal: Positive for myalgias. Negative for gait problem.  Skin: Negative for rash.  Allergic/Immunologic: Negative for immunocompromised state.  Neurological: Negative for speech difficulty.  Hematological: Negative for adenopathy.  Psychiatric/Behavioral: Negative for confusion.  All other systems reviewed and are negative.    Allergies  Review of patient's allergies indicates no known allergies.  Home Medications   Current Outpatient Rx  Name  Route  Sig  Dispense  Refill  . EXPIRED: omeprazole (PRILOSEC) 20 MG capsule   Oral   Take 20 mg by mouth 2 (two) times daily as needed.          Triage Vitals: BP 142/81  Pulse 90  Temp(Src) 98.4 F (36.9 C) (Oral)  Ht 5\' 10"  (1.778 m)  Wt 195 lb (88.451 kg)  BMI 27.98 kg/m2  SpO2 100%  Physical Exam  Nursing note and vitals reviewed. Constitutional: He  is oriented to person, place, and time. He appears well-developed and well-nourished.  HENT:  Head: Normocephalic and atraumatic.  Eyes: EOM are normal. Pupils are equal, round, and reactive to light.  Neck: Normal range of motion. Neck supple.  Cardiovascular: Normal rate, normal heart sounds and intact distal pulses.   Pulmonary/Chest: Effort normal and breath sounds normal.  Abdominal: Bowel sounds are normal. He exhibits no distension. There is no tenderness.  No focal tenderness.  Musculoskeletal:  Normal range of motion. He exhibits no edema and no tenderness.  Neurological: He is alert and oriented to person, place, and time. He has normal strength. No cranial nerve deficit or sensory deficit.  Skin: Skin is warm and dry. No rash noted.  Psychiatric: He has a normal mood and affect.    ED Course  Procedures (including critical care time)  DIAGNOSTIC STUDIES: Oxygen Saturation is 100% on room air, normal by my interpretation.    COORDINATION OF CARE: 8:59 PM -Will order labs. Patient verbalizes understanding and agrees with treatment plan.    Labs Review Labs Reviewed  CBC WITH DIFFERENTIAL - Abnormal; Notable for the following:    Monocytes Relative 14 (*)    All other components within normal limits  COMPREHENSIVE METABOLIC PANEL  LIPASE, BLOOD  COMPREHENSIVE METABOLIC PANEL   Imaging Review No results found.  EKG Interpretation   None       MDM   1. Abdominal pain   2. Diarrhea   3. Cough    9:29 PM 32 y.o. male who presents with abdominal pain since yesterday. The patient notes a tightness sensation in his upper abdomen and lower ribs today while wearing a kevlar vest which he wears at work. He notes that upon removing the vest he felt much better. He has only mild pain here but no focal abdominal pain. He does note that he has a URI with cough. He denies any fevers, vomiting. He has had several episodes of watery diarrhea today. As his abdomen is benign he is afebrile and tolerating po well I have a low suspicion for acute surgical abdominal pathology. I suspect likely abdominal wall or chest strain with coughing. Will get screening labwork, Percocet for pain, and repeat evaluation.  10:55 PM: Pt feeling mildly better. I interpreted/reviewed the labs which were non-contributory. I suspect his sx are related to abd wall/chest wall strain from coughing. W/out focal ttp, non-contrib labs, well appearing I do not think imaging is beneficial at this time.  Will  provide Rx for symptomatic therapy  I have discussed the diagnosis/risks/treatment options with the patient and wife and believe the pt to be eligible for discharge home to follow-up with pcp as needed. We also discussed returning to the ED immediately if new or worsening sx occur. We discussed the sx which are most concerning (e.g., worsening pain, fever, vomiting) that necessitate immediate return. Any new prescriptions provided to the patient are listed below.  Discharge Medication List as of 12/11/2012 10:57 PM    START taking these medications   Details  benzonatate (TESSALON) 100 MG capsule Take 1 capsule (100 mg total) by mouth 2 (two) times daily as needed for cough., Starting 12/11/2012, Until Discontinued, Print    chlorpheniramine-HYDROcodone (TUSSIONEX PENNKINETIC ER) 10-8 MG/5ML LQCR Take 5 mLs by mouth at bedtime as needed for cough., Starting 12/11/2012, Until Discontinued, Print    oxyCODONE-acetaminophen (PERCOCET) 5-325 MG per tablet Take 1 tablet by mouth every 4 (four) hours as needed., Starting 12/11/2012, Until Discontinued, Print  I personally performed the services described in this documentation, which was scribed in my presence. The recorded information has been reviewed and is accurate.    Junius Argyle, MD 12/11/12 [phone]

## 2012-12-12 LAB — COMPREHENSIVE METABOLIC PANEL

## 2013-01-19 ENCOUNTER — Other Ambulatory Visit: Payer: Self-pay

## 2013-01-19 MED ORDER — OMEPRAZOLE 20 MG PO CPDR: 20.0000 mg | DELAYED_RELEASE_CAPSULE | Freq: Two times a day (BID) | ORAL | Status: AC | PRN

## 2013-01-19 MED ORDER — OMEPRAZOLE 20 MG PO CPDR: 20.0000 mg | DELAYED_RELEASE_CAPSULE | Freq: Two times a day (BID) | ORAL | Status: DC | PRN

## 2013-01-19 NOTE — Telephone Encounter (Signed)
Pt request refill omeprazole to [facility] pharmacy; pt last seen 01/22/2011.Please advise.

## 2013-01-19 NOTE — Telephone Encounter (Signed)
Rx sent through e-scribe  

## 2013-06-28 ENCOUNTER — Ambulatory Visit: Payer: Self-pay | Admitting: Family Medicine

## 2013-07-10 ENCOUNTER — Ambulatory Visit: Payer: Self-pay | Admitting: Family Medicine

## 2013-07-13 ENCOUNTER — Other Ambulatory Visit: Payer: Self-pay | Admitting: Orthopaedic Surgery

## 2013-07-13 DIAGNOSIS — M25562 Pain in left knee: Secondary | ICD-10-CM

## 2013-07-21 ENCOUNTER — Ambulatory Visit
Admission: RE | Admit: 2013-07-21 | Discharge: 2013-07-21 | Disposition: A | Discharge status: Home / Self Care | Payer: 59 | Source: Ambulatory Visit | Attending: Orthopaedic Surgery | Admitting: Orthopaedic Surgery

## 2013-07-21 DIAGNOSIS — M25562 Pain in left knee: Secondary | ICD-10-CM

## 2014-05-20 ENCOUNTER — Emergency Department (HOSPITAL_BASED_OUTPATIENT_CLINIC_OR_DEPARTMENT_OTHER): Payer: 59

## 2014-05-20 ENCOUNTER — Emergency Department (HOSPITAL_BASED_OUTPATIENT_CLINIC_OR_DEPARTMENT_OTHER)
Admission: EM | Admit: 2014-05-20 | Discharge: 2014-05-20 | Disposition: A | Discharge status: Home / Self Care | Payer: 59 | Attending: Emergency Medicine | Admitting: Emergency Medicine

## 2014-05-20 ENCOUNTER — Encounter (HOSPITAL_BASED_OUTPATIENT_CLINIC_OR_DEPARTMENT_OTHER): Payer: Self-pay | Admitting: *Deleted

## 2014-05-20 DIAGNOSIS — Y998 Other external cause status: Secondary | ICD-10-CM | POA: Diagnosis not present

## 2014-05-20 DIAGNOSIS — Y9389 Activity, other specified: Secondary | ICD-10-CM | POA: Insufficient documentation

## 2014-05-20 DIAGNOSIS — Y9289 Other specified places as the place of occurrence of the external cause: Secondary | ICD-10-CM | POA: Insufficient documentation

## 2014-05-20 DIAGNOSIS — W010XXA Fall on same level from slipping, tripping and stumbling without subsequent striking against object, initial encounter: Secondary | ICD-10-CM | POA: Insufficient documentation

## 2014-05-20 DIAGNOSIS — Z8659 Personal history of other mental and behavioral disorders: Secondary | ICD-10-CM | POA: Diagnosis not present

## 2014-05-20 DIAGNOSIS — Z79899 Other long term (current) drug therapy: Secondary | ICD-10-CM | POA: Insufficient documentation

## 2014-05-20 DIAGNOSIS — S6991XA Unspecified injury of right wrist, hand and finger(s), initial encounter: Secondary | ICD-10-CM

## 2014-05-20 DIAGNOSIS — W19XXXA Unspecified fall, initial encounter: Secondary | ICD-10-CM

## 2014-05-20 NOTE — ED Provider Notes (Signed)
CSN: [id number]     Arrival date & time 05/20/14  1637 History   First MD Initiated Contact with Patient 05/20/14 1709     Chief Complaint  Patient presents with  . Hand Injury     (Consider location/radiation/quality/duration/timing/severity/associated sxs/prior Treatment) The history is provided by the patient and medical records.   34 year old male with past medical history significant for anxiety, presenting to the ED for right hand injury. Patient states he tripped and fell on a step and landed on his right hand. He denies any head injury or loss of consciousness. He states he has throbbing pain along the dorsal aspect of right hand.  Denies numbness or paresthesias.  Patient is right hand dominant.  No intervention tried PTA.  Past Medical History  Diagnosis Date  . Anxiety    Past Surgical History  Procedure Laterality Date  . Gastric bypass     History reviewed. No pertinent family history. History  Substance Use Topics  . Smoking status: Never Smoker   . Smokeless tobacco: Current User    Types: Snuff  . Alcohol Use: Yes     Comment: rarely    Review of Systems  Musculoskeletal: Positive for arthralgias.  All other systems reviewed and are negative.     Allergies  Review of patient's allergies indicates no known allergies.  Home Medications   Prior to Admission medications   Medication Sig Start Date End Date Taking? Authorizing Provider  omeprazole (PRILOSEC) 20 MG capsule Take 1 capsule (20 mg total) by mouth 2 (two) times daily as needed. 01/19/13 01/19/14  Lorre Munroeegina W Baity, NP   BP 137/88 mmHg  Pulse 94  Temp(Src) 98.2 F (36.8 C) (Oral)  Resp 16  Ht 5\' 10"  (1.778 m)  Wt 195 lb (88.451 kg)  BMI 27.98 kg/m2   Physical Exam  Constitutional: He is oriented to person, place, and time. He appears well-developed and well-nourished. No distress.  HENT:  Head: Normocephalic and atraumatic.  Mouth/Throat: Oropharynx is clear and moist.  Eyes:  Conjunctivae and EOM are normal. Pupils are equal, round, and reactive to light.  Neck: Normal range of motion. Neck supple.  Cardiovascular: Normal rate, regular rhythm and normal heart sounds.   Pulmonary/Chest: Effort normal and breath sounds normal. No respiratory distress. He has no wheezes.  Musculoskeletal: Normal range of motion.  Diffuse mild swelling of dorsal right hand, no bony deformity, full range of motion of wrist and all fingersstrong radial pulse and cap refill, normal sensation throughout,    Neurological: He is alert and oriented to person, place, and time.  Skin: Skin is warm. He is not diaphoretic.  Psychiatric: He has a normal mood and affect.  Nursing note and vitals reviewed.   ED Course  Procedures (including critical care time) Labs Review Labs Reviewed - No data to display  Imaging Review Dg Hand Complete Right  05/20/2014   CLINICAL DATA:  Status post fall. Caught himself with his right hand. Pain.  EXAM: RIGHT HAND - COMPLETE 3+ VIEW  COMPARISON:  None.  FINDINGS: There is no evidence of fracture or dislocation. There is no evidence of arthropathy or other focal bone abnormality. Soft tissues are unremarkable.  IMPRESSION: No acute osseous injury of the right hand.   Electronically Signed   By: Elige KoHetal  Patel   On: 05/20/2014 17:06     EKG Interpretation None      MDM   Final diagnoses:  Fall, initial encounter  Hand injury, right, initial encounter  34 year old male with fall onto right hand. No head injury or loss of consciousness. Exam with diffuse mild swelling of dorsal right hand without bony deformity. Hand is neurovascularly intact. X-ray negative for acute fracture.  Patient will be discharged home with supportive care. Encouraged RICE routine.  FU with PCP.  Discussed plan with patient, he/she acknowledged understanding and agreed with plan of care.  Return precautions given for new or worsening symptoms.  Garlon Hatchet, PA-C 05/20/14  1731  Nelva Nay, MD 05/25/14 [phone]

## 2014-05-20 NOTE — Discharge Instructions (Signed)
Ice and elevate hand at home to help with pain/swelling.  May take ibuprofen/motrin to help as well. Return to the ED for new or worsening symptoms.

## 2014-05-20 NOTE — ED Notes (Signed)
Pt c/o right hand injury x 5 hrs ago

## 2014-05-20 NOTE — ED Notes (Signed)
Patient transported to X-ray 

## 2014-06-04 ENCOUNTER — Encounter: Payer: Self-pay | Admitting: Sports Medicine

## 2014-06-04 ENCOUNTER — Ambulatory Visit
Admission: RE | Admit: 2014-06-04 | Discharge: 2014-06-04 | Disposition: A | Discharge status: Home / Self Care | Payer: 59 | Source: Ambulatory Visit | Attending: Sports Medicine | Admitting: Sports Medicine

## 2014-06-04 ENCOUNTER — Ambulatory Visit (INDEPENDENT_AMBULATORY_CARE_PROVIDER_SITE_OTHER): Payer: 59 | Admitting: Sports Medicine

## 2014-06-04 VITALS — BP 133/89 | Ht 69.0 in | Wt 195.0 lb

## 2014-06-04 DIAGNOSIS — M79641 Pain in right hand: Secondary | ICD-10-CM

## 2014-06-04 MED ORDER — DICLOFENAC SODIUM 75 MG PO TBEC: DELAYED_RELEASE_TABLET | ORAL | Status: DC

## 2014-06-05 NOTE — Progress Notes (Signed)
   Subjective:    Patient ID: Randy Villa, male    DOB: 1980-06-15, 34 y.o.   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 Pain began after he fell onto an outstretched hand and suffered a hyperextension injury to his fourth and fifth fingers. He had immediate pain which he localizes to the MCP joints. He was seen at a local urgent care and x-rays of his hand were unremarkable. Since his injury his symptoms have improved but not resolved. He did get diffuse swelling in his hand and he has noticed stiffness in the fourth and fifth fingers. No numbness or tingling. No pain along the radial aspect of the hand. No wrist pain. No prior injuries to this hand in the past. No prior hand or wrist surgeries. He is right-hand dominant. He has been on light duty for the past week due to his injury.  Past medical history reviewed Medications reviewed Allergies reviewed    Review of Systems As above    Objective:   Physical Exam Well-developed, well-nourished. No acute distress.  Right wrist: Full range of motion. No effusion. No bony or soft tissue tenderness to palpation. Right hand: There is tenderness to palpation along the palmar aspect of the hand and the dorsal aspect of the hand at the fourth and fifth MCP joints. Mild soft tissue swelling. No ecchymosis. Patient is almost able to make a complete fist but is limited somewhat by pain and swelling. No clinical angulation or malrotation of any of the fingers appreciated. No tenderness to palpation along the metacarpal shafts. Flexor and extensor tendons are intact. MCP joints are stable to passive hyperextension. Good radial ulnar pulses. Good grip strength.  X-rays of his right hand are repeated today. No fracture is seen. No dislocation.       Assessment & Plan:  Right hand pain secondary to fourth and fifth MCP joint sprains  Reassurance regarding his x-rays.  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 We'll place the patient on Voltaren and he'll take 75 mg twice daily with food for one week. I will have him buddy tape the fourth and fifth fingers and follow-up with me in 2 weeks. Call with questions or concerns prior to his follow-up visit.

## 2014-06-18 ENCOUNTER — Ambulatory Visit (INDEPENDENT_AMBULATORY_CARE_PROVIDER_SITE_OTHER): Payer: 59 | Admitting: Sports Medicine

## 2014-06-18 ENCOUNTER — Encounter: Payer: Self-pay | Admitting: Sports Medicine

## 2014-06-18 VITALS — BP 125/77 | Ht 69.0 in | Wt 195.0 lb

## 2014-06-18 DIAGNOSIS — M79641 Pain in right hand: Secondary | ICD-10-CM | POA: Diagnosis not present

## 2014-06-18 NOTE — Progress Notes (Signed)
   Subjective:    Patient ID: Randy Villa, male    DOB: [birth year], 34 y.o.   MRN: [id number]  HPI   Patient comes in today for follow-up on right hand pain. Unfortunately, his pain has not improved much over the past couple of weeks. He still has pain primarily along the fourth and fifth flexor tendons with grip. He has not noticed any swelling. X-rays show no obvious fracture of his hand or fingers. He does state that buddy taping does seem to help. 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    Review of Systems     Objective:   Physical Exam  Well-developed, well-nourished. No acute distress. Awake alert and oriented 3.  Right hand: No obvious soft tissue swelling. No deformity. Patient is able to make a complete fist. He is tender to palpation along the course of the fourth and fifth flexor tendons but no active triggering. Full flexion and extension of all fingers. No tenderness to palpation across the dorsum of the hand. Neurovascular intact distally.      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 He will remain on light duty until his appointment with Dr. Merlyn LotKuzma. Follow-up with me when necessary.

## 2014-06-18 NOTE — Patient Instructions (Signed)
Dr. Gary Kuzma Wednesday 06/19/14 at 11am Arrival time is 1030Cindee Saltam [address], [place], [place] [address] [phone]

## 2014-06-21 ENCOUNTER — Encounter: Payer: Self-pay | Admitting: Family Medicine

## 2014-06-21 ENCOUNTER — Ambulatory Visit (INDEPENDENT_AMBULATORY_CARE_PROVIDER_SITE_OTHER): Payer: 59 | Admitting: Family Medicine

## 2014-06-21 VITALS — BP 124/64 | HR 71 | Ht 69.0 in | Wt 195.0 lb

## 2014-06-21 DIAGNOSIS — F1010 Alcohol abuse, uncomplicated: Secondary | ICD-10-CM | POA: Diagnosis not present

## 2014-06-21 DIAGNOSIS — M779 Enthesopathy, unspecified: Secondary | ICD-10-CM | POA: Diagnosis not present

## 2014-06-21 NOTE — Assessment & Plan Note (Signed)
Seen by hand surgeon placed in a buddy taped brace. He'll be following up with orthopedics regarding this.

## 2014-06-21 NOTE — Assessment & Plan Note (Signed)
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 He has decided to try and get his life together area he has enrolled in an inpatient rehabilitation program in [place]. One of his friends went there and had success. He comes today wants reaches fill out some FMLA for the 28 day inpatient treatment. Copy of this is in the chart. I like to see him back in the week after his discharge. He is currently been 6 days without any alcohol and has not had any seizure, he has had some anxiety but it is not been terrible. 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 He is on limited duty secondary to a hand injury. This means essentially he's doing desk duty. He denies any other illicit drug use.  We had a long discussion about this today. I'm very proud of him for making this decision proceeding with all the necessary arrangements.

## 2014-06-21 NOTE — Progress Notes (Signed)
   Subjective:    Patient ID: Randy DustJason R Villa, male    DOB: [birth year], 34 y.o.   MRN: [id number]  HPI #1. Follow-up tendinitis in his right third fourth and fifth finger flexor tendons. He was seen by the hand surgeon. They placed him in a buddy taping splint system. #2. Wants to discuss his alcohol consumption. Says it has been an ongoing problem with him for quite a while. He is currently in the process of getting a divorce from his second wife. He wants to try inpatient rehabilitation which he has never tried before. He has been stained his mom's house for the last 5-1/2 days. He has had no alcohol while there. He has not had any seizures, no behavioral outbursts. He has felt a little bit anxious and jittery but said it was not something he couldn't overcome. He states his reasons for seeking treatment include his recent marital problems, he doesn't want to fail at his second marriage. He has a lot of stressors which include child from his first marriage she has microcephaly and is being followed for multiple medical problems including ensuing blindness.  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   Review of Systems  Constitutional: Negative for activity change, appetite change and unexpected weight change.  Neurological: Positive for headaches. Negative for tremors, seizures, speech difficulty, weakness and light-headedness.  Psychiatric/Behavioral: Positive for sleep disturbance, dysphoric mood and decreased concentration. Negative for suicidal ideas, hallucinations, behavioral problems, confusion, self-injury and agitation. The patient is nervous/anxious. The patient is not hyperactive.        Objective:   Physical Exam  Constitutional: He appears well-developed and well-nourished.  Eyes: Conjunctivae and EOM are normal. Pupils are equal, round, and reactive to light. No scleral icterus.  Neck:  Normal range of motion. Neck supple.  Cardiovascular: Regular rhythm and normal heart sounds.   Pulmonary/Chest: Effort normal and breath sounds normal.  Skin: No rash noted.  Left forearm sleeve tattoo  Psychiatric: He has a normal mood and affect. His behavior is normal. Judgment and thought content normal.  Neatly dressed, good eye contact. No psychomotor retardation or agitation. He speaks in appropriate sentences with normal fluency in speech content. He seems very calm and reasonable. Affect is interactive. Intact remote and recent memory.          Assessment & Plan:

## 2014-08-28 ENCOUNTER — Encounter (HOSPITAL_BASED_OUTPATIENT_CLINIC_OR_DEPARTMENT_OTHER): Payer: Self-pay | Admitting: *Deleted

## 2014-08-28 ENCOUNTER — Emergency Department (HOSPITAL_BASED_OUTPATIENT_CLINIC_OR_DEPARTMENT_OTHER)
Admission: EM | Admit: 2014-08-28 | Discharge: 2014-08-28 | Disposition: A | Discharge status: Home / Self Care | Payer: 59 | Attending: Emergency Medicine | Admitting: Emergency Medicine

## 2014-08-28 ENCOUNTER — Emergency Department (HOSPITAL_BASED_OUTPATIENT_CLINIC_OR_DEPARTMENT_OTHER): Payer: 59

## 2014-08-28 DIAGNOSIS — F329 Major depressive disorder, single episode, unspecified: Secondary | ICD-10-CM | POA: Diagnosis not present

## 2014-08-28 DIAGNOSIS — R197 Diarrhea, unspecified: Secondary | ICD-10-CM | POA: Diagnosis not present

## 2014-08-28 DIAGNOSIS — R51 Headache: Secondary | ICD-10-CM | POA: Diagnosis not present

## 2014-08-28 DIAGNOSIS — R3919 Other difficulties with micturition: Secondary | ICD-10-CM | POA: Insufficient documentation

## 2014-08-28 DIAGNOSIS — H539 Unspecified visual disturbance: Secondary | ICD-10-CM | POA: Insufficient documentation

## 2014-08-28 DIAGNOSIS — F419 Anxiety disorder, unspecified: Secondary | ICD-10-CM | POA: Diagnosis not present

## 2014-08-28 DIAGNOSIS — R631 Polydipsia: Secondary | ICD-10-CM | POA: Diagnosis not present

## 2014-08-28 DIAGNOSIS — Z79899 Other long term (current) drug therapy: Secondary | ICD-10-CM | POA: Diagnosis not present

## 2014-08-28 DIAGNOSIS — R200 Anesthesia of skin: Secondary | ICD-10-CM | POA: Diagnosis not present

## 2014-08-28 DIAGNOSIS — F4310 Post-traumatic stress disorder, unspecified: Secondary | ICD-10-CM | POA: Insufficient documentation

## 2014-08-28 DIAGNOSIS — R519 Headache, unspecified: Secondary | ICD-10-CM

## 2014-08-28 HISTORY — DX: Major depressive disorder, single episode, unspecified: F32.9

## 2014-08-28 HISTORY — DX: Unspecified convulsions: R56.9

## 2014-08-28 HISTORY — DX: Depression, unspecified: F32.A

## 2014-08-28 HISTORY — DX: Post-traumatic stress disorder, unspecified: F43.10

## 2014-08-28 LAB — BASIC METABOLIC PANEL
Anion gap: 3 — ABNORMAL LOW (ref 5–15)
BUN: 16 mg/dL (ref 6–20)
CO2: 24 mmol/L (ref 22–32)
Calcium: 8.6 mg/dL — ABNORMAL LOW (ref 8.9–10.3)
Chloride: 111 mmol/L (ref 101–111)
Creatinine, Ser: 0.92 mg/dL (ref 0.61–1.24)
GFR calc Af Amer: >60 mL/min (ref >60)
Glucose, Bld: 84 mg/dL (ref 65–99)
POTASSIUM: 4 mmol/L (ref 3.5–5.1)
SODIUM: 138 mmol/L (ref 135–145)

## 2014-08-28 LAB — CBC WITH DIFFERENTIAL/PLATELET
BASOS PCT: 1 % (ref 0–1)
Basophils Absolute: 0 10*3/uL (ref 0.0–0.1)
Eosinophils Absolute: 0.2 10*3/uL (ref 0.0–0.7)
Eosinophils Relative: 5 % (ref 0–5)
HCT: 40.3 % (ref 39.0–52.0)
HEMOGLOBIN: 13.9 g/dL (ref 13.0–17.0)
Lymphocytes Relative: 33 % (ref 12–46)
Lymphs Abs: 1.4 10*3/uL (ref 0.7–4.0)
MCH: 28.1 pg (ref 26.0–34.0)
MCHC: 34.5 g/dL (ref 30.0–36.0)
MCV: 81.4 fL (ref 78.0–100.0)
Monocytes Absolute: 0.5 10*3/uL (ref 0.1–1.0)
Monocytes Relative: 12 % (ref 3–12)
NEUTROS ABS: 2.1 10*3/uL (ref 1.7–7.7)
Neutrophils Relative %: 49 % (ref 43–77)
PLATELETS: 176 10*3/uL (ref 150–400)
RBC: 4.95 MIL/uL (ref 4.22–5.81)
RDW: 14 % (ref 11.5–15.5)
WBC: 4.2 10*3/uL (ref 4.0–10.5)

## 2014-08-28 MED ORDER — DEXAMETHASONE SODIUM PHOSPHATE 10 MG/ML IJ SOLN
10.0000 mg | Freq: Once | INTRAMUSCULAR | Status: AC
  Administered 2014-08-28: 10 mg via INTRAVENOUS
  Filled 2014-08-28: qty 1

## 2014-08-28 MED ORDER — KETOROLAC TROMETHAMINE 30 MG/ML IJ SOLN
30.0000 mg | Freq: Once | INTRAMUSCULAR | Status: AC
  Administered 2014-08-28: 30 mg via INTRAVENOUS
  Filled 2014-08-28: qty 1

## 2014-08-28 MED ORDER — DIPHENHYDRAMINE HCL 50 MG/ML IJ SOLN
25.0000 mg | Freq: Once | INTRAMUSCULAR | Status: AC
  Administered 2014-08-28: 25 mg via INTRAVENOUS
  Filled 2014-08-28: qty 1

## 2014-08-28 MED ORDER — METOCLOPRAMIDE HCL 5 MG/ML IJ SOLN
10.0000 mg | Freq: Once | INTRAMUSCULAR | Status: AC
  Administered 2014-08-28: 10 mg via INTRAVENOUS
  Filled 2014-08-28: qty 2

## 2014-08-28 MED ORDER — SODIUM CHLORIDE 0.9 % IV BOLUS (SEPSIS)
1000.0000 mL | Freq: Once | INTRAVENOUS | Status: AC
  Administered 2014-08-28: 1000 mL via INTRAVENOUS

## 2014-08-28 NOTE — ED Notes (Signed)
Pt's spouse reports pt is 90 days sober from ETOH and has been experiencing HA unrelieved by NSAIDS or topamax and over the last 3 days has had several episodes where he blacks out with blank stares and will not respond to her verbally. Pt CAO in triage.

## 2014-08-28 NOTE — ED Provider Notes (Signed)
CSN: [id number]     Arrival date & time 08/28/14  2030 History  This chart was scribed for Tilden Fossa, MD by Lyndel Safe, ED Scribe. This patient was seen in room MH04/MH04 and the patient's care was started 8:52 PM.   Chief Complaint  Patient presents with  . Headache   The history is provided by the patient and the spouse. No language interpreter was used.   HPI Comments: Randy Villa is a 34 y.o. male, with a PMhx of anxiety, depression, PTSD, EtOH abuse, and seizures, who presents to the Emergency Department complaining of a gradually worsening, waxing and waning headache onset 1 month. Pt rates his current headache at a 6/10 with mild generalized feelings of numbness and tingling. The pt reports the headache is a dull, frontal headache that radiates to bilateral sides. He states the headache is worse in the evenings. He reports associated intermittent double vision, dark colored urination, and diarrhea all of which are not currently present. Early in the progression of his symptoms wife reports pt was experiencing difficulty urinating and polydipsia. His wife additionally reports the pt has been having episodes where his pupils roll back in his head, he is non-verbal with a blank stare, and tachycardic that last for approximately 3 minutes. She states the pt does not have any recollection of these episodes s/p incident. She reports the last episode of non-verbal, blank stares was 40 minutes pta prompting ED evaluation tonight. Per spouse, pt is 90 days sober from EtOH and was started on daily naltrexone, zoloft, and ambien during inpatient treatment approximately 3 months ago. Wife states pt has a PShx of gastric bypass procedure and experiences several episodes a week of hypoglycemia and diaphoresis. Pt has taken NSAIDs and topamax for his headaches without relief. He has an appointment with PCP for his headache in 2 days. He denies his current headache to be similar to past migraines or sinus  headaches. Additionally denies dysuria, nausea, or vomiting.   Past Medical History  Diagnosis Date  . Anxiety   . Seizures   . Depression   . PTSD (post-traumatic stress disorder)    Past Surgical History  Procedure Laterality Date  . Gastric bypass     No family history on file. History  Substance Use Topics  . Smoking status: Never Smoker   . Smokeless tobacco: Current User    Types: Snuff  . Alcohol Use: No     Comment: rarely    Review of Systems  Eyes: Positive for visual disturbance.  Gastrointestinal: Positive for diarrhea. Negative for nausea and vomiting.  Endocrine: Positive for polydipsia.  Genitourinary: Positive for difficulty urinating. Negative for dysuria.  Neurological: Positive for numbness and headaches.  Psychiatric/Behavioral: Positive for confusion.  All other systems reviewed and are negative.  Allergies  Review of patient's allergies indicates no known allergies.  Home Medications   Prior to Admission medications   Medication Sig Start Date End Date Taking? Authorizing Provider  naltrexone (DEPADE) 50 MG tablet Take 25 mg by mouth daily.   Yes Historical Provider, MD  omeprazole (PRILOSEC) 20 MG capsule Take 1 capsule (20 mg total) by mouth 2 (two) times daily as needed. 01/19/13 08/28/14 Yes Lorre Munroe, NP  sertraline (ZOLOFT) 100 MG tablet Take 100 mg by mouth daily.   Yes Historical Provider, MD  topiramate (TOPAMAX) 25 MG tablet Take 25 mg by mouth 2 (two) times daily.   Yes Historical Provider, MD  zolpidem (AMBIEN) 10 MG tablet Take  10 mg by mouth at bedtime as needed for sleep.   Yes Historical Provider, MD  diclofenac (VOLTAREN) 75 MG EC tablet TAKE ONE TAB BID FOR 7 DAYS THEN PRN 06/04/14   Ozzie Hoyle Draper, DO   BP 124/74 mmHg  Pulse 65  Temp(Src) 98.5 F (36.9 C) (Oral)  Resp 20  Ht 5\' 10"  (1.778 m)  Wt 195 lb (88.451 kg)  BMI 27.98 kg/m2  SpO2 100% Physical Exam  Constitutional: He is oriented to person, place, and time. He  appears well-developed and well-nourished.  HENT:  Head: Normocephalic and atraumatic.  Right Ear: External ear normal.  Left Ear: External ear normal.  Mouth/Throat: Oropharynx is clear and moist.  Eyes: EOM are normal. Pupils are equal, round, and reactive to light.  Neck: Neck supple.  Cardiovascular: Normal rate and regular rhythm.   No murmur heard. Pulmonary/Chest: Effort normal and breath sounds normal. No respiratory distress.  Abdominal: Soft. There is no tenderness. There is no rebound and no guarding.  Musculoskeletal: He exhibits no edema or tenderness.  Neurological: He is alert and oriented to person, place, and time. No cranial nerve deficit. Coordination normal.  Skin: Skin is warm and dry.  Psychiatric: He has a normal mood and affect. His behavior is normal.  Nursing note and vitals reviewed.   ED Course  Procedures  DIAGNOSTIC STUDIES: Oxygen Saturation is 100% on RA, normal by my interpretation.    COORDINATION OF CARE: 9:05 PM Discussed treatment plan which includes to order IV fluids, Reglan, and benaryl with pt. Will also order head CT and diagnostic labs. Pt and wife acknowledge and agree to plan.   Labs Review Labs Reviewed  BASIC METABOLIC PANEL - Abnormal; Notable for the following:    Calcium 8.6 (*)    Anion gap 3 (*)    All other components within normal limits  CBC WITH DIFFERENTIAL/PLATELET    Imaging Review Ct Head Wo Contrast  08/28/2014   CLINICAL DATA:  Headache  EXAM: CT HEAD WITHOUT CONTRAST  TECHNIQUE: Contiguous axial images were obtained from the base of the skull through the vertex without intravenous contrast.  COMPARISON:  02/27/2006  FINDINGS: No acute cortical infarct, hemorrhage, or mass lesion ispresent. Ventricles are of normal size. No significant extra-axial fluid collection is present. The paranasal sinuses andmastoid air cells are clear. The osseous skull is intact.  IMPRESSION: No acute intracranial abnormalities.  Normal  brain.   Electronically Signed   By: Signa Kell M.D.   On: 08/28/2014 21:55     EKG Interpretation None      MDM   Final diagnoses:  Bad headache    Pt here for evaluation of HA.  Hx and presentation not c/w SAH/meningitis.  Given new onset HA CT obtained to eval for structural abnormalities - NAICA.  Hx and presentation atypical for seizures (no motor activity, no postictal period).  Pt improved after headache cocktail.  Discussed home care, follow up, return precautions for headache.     I personally performed the services described in this documentation, which was scribed in my presence. The recorded information has been reviewed and is accurate.    Tilden Fossa, MD 08/28/14 [phone]

## 2014-08-28 NOTE — Discharge Instructions (Signed)
Headaches, Frequently Asked Questions °MIGRAINE HEADACHES °Q: What is migraine? What causes it? How can I treat it? °A: Generally, migraine headaches begin as a dull ache. Then they develop into a constant, throbbing, and pulsating pain. You may experience pain at the temples. You may experience pain at the front or back of one or both sides of the head. The pain is usually accompanied by a combination of: °· Nausea. °· Vomiting. °· Sensitivity to light and noise. °Some people (about 15%) experience an aura (see below) before an attack. The cause of migraine is believed to be chemical reactions in the brain. Treatment for migraine may include over-the-counter or prescription medications. It may also include self-help techniques. These include relaxation training and biofeedback.  °Q: What is an aura? °A: About 15% of people with migraine get an "aura". This is a sign of neurological symptoms that occur before a migraine headache. You may see wavy or jagged lines, dots, or flashing lights. You might experience tunnel vision or blind spots in one or both eyes. The aura can include visual or auditory hallucinations (something imagined). It may include disruptions in smell (such as strange odors), taste or touch. Other symptoms include: °· Numbness. °· A "pins and needles" sensation. °· Difficulty in recalling or speaking the correct word. °These neurological events may last as long as 60 minutes. These symptoms will fade as the headache begins. °Q: What is a trigger? °A: Certain physical or environmental factors can lead to or "trigger" a migraine. These include: °· Foods. °· Hormonal changes. °· Weather. °· Stress. °It is important to remember that triggers are different for everyone. To help prevent migraine attacks, you need to figure out which triggers affect you. Keep a headache diary. This is a good way to track triggers. The diary will help you talk to your healthcare professional about your condition. °Q: Does  weather affect migraines? °A: Bright sunshine, hot, humid conditions, and drastic changes in barometric pressure may lead to, or "trigger," a migraine attack in some people. But studies have shown that weather does not act as a trigger for everyone with migraines. °Q: What is the link between migraine and hormones? °A: Hormones start and regulate many of your body's functions. Hormones keep your body in balance within a constantly changing environment. The levels of hormones in your body are unbalanced at times. Examples are during menstruation, pregnancy, or menopause. That can lead to a migraine attack. In fact, about three quarters of all women with migraine report that their attacks are related to the menstrual cycle.  °Q: Is there an increased risk of stroke for migraine sufferers? °A: The likelihood of a migraine attack causing a stroke is very remote. That is not to say that migraine sufferers cannot have a stroke associated with their migraines. In persons under age 40, the most common associated factor for stroke is migraine headache. But over the course of a person's normal life span, the occurrence of migraine headache may actually be associated with a reduced risk of dying from cerebrovascular disease due to stroke.  °Q: What are acute medications for migraine? °A: Acute medications are used to treat the pain of the headache after it has started. Examples over-the-counter medications, NSAIDs, ergots, and triptans.  °Q: What are the triptans? °A: Triptans are the newest class of abortive medications. They are specifically targeted to treat migraine. Triptans are vasoconstrictors. They moderate some chemical reactions in the brain. The triptans work on receptors in your brain. Triptans help   to restore the balance of a neurotransmitter called serotonin. Fluctuations in levels of serotonin are thought to be a main cause of migraine.  °Q: Are over-the-counter medications for migraine effective? °A:  Over-the-counter, or "OTC," medications may be effective in relieving mild to moderate pain and associated symptoms of migraine. But you should see your caregiver before beginning any treatment regimen for migraine.  °Q: What are preventive medications for migraine? °A: Preventive medications for migraine are sometimes referred to as "prophylactic" treatments. They are used to reduce the frequency, severity, and length of migraine attacks. Examples of preventive medications include antiepileptic medications, antidepressants, beta-blockers, calcium channel blockers, and NSAIDs (nonsteroidal anti-inflammatory drugs). °Q: Why are anticonvulsants used to treat migraine? °A: During the past few years, there has been an increased interest in antiepileptic drugs for the prevention of migraine. They are sometimes referred to as "anticonvulsants". Both epilepsy and migraine may be caused by similar reactions in the brain.  °Q: Why are antidepressants used to treat migraine? °A: Antidepressants are typically used to treat people with depression. They may reduce migraine frequency by regulating chemical levels, such as serotonin, in the brain.  °Q: What alternative therapies are used to treat migraine? °A: The term "alternative therapies" is often used to describe treatments considered outside the scope of conventional Western medicine. Examples of alternative therapy include acupuncture, acupressure, and yoga. Another common alternative treatment is herbal therapy. Some herbs are believed to relieve headache pain. Always discuss alternative therapies with your caregiver before proceeding. Some herbal products contain arsenic and other toxins. °TENSION HEADACHES °Q: What is a tension-type headache? What causes it? How can I treat it? °A: Tension-type headaches occur randomly. They are often the result of temporary stress, anxiety, fatigue, or anger. Symptoms include soreness in your temples, a tightening band-like sensation  around your head (a "vice-like" ache). Symptoms can also include a pulling feeling, pressure sensations, and contracting head and neck muscles. The headache begins in your forehead, temples, or the back of your head and neck. Treatment for tension-type headache may include over-the-counter or prescription medications. Treatment may also include self-help techniques such as relaxation training and biofeedback. °CLUSTER HEADACHES °Q: What is a cluster headache? What causes it? How can I treat it? °A: Cluster headache gets its name because the attacks come in groups. The pain arrives with little, if any, warning. It is usually on one side of the head. A tearing or bloodshot eye and a runny nose on the same side of the headache may also accompany the pain. Cluster headaches are believed to be caused by chemical reactions in the brain. They have been described as the most severe and intense of any headache type. Treatment for cluster headache includes prescription medication and oxygen. °SINUS HEADACHES °Q: What is a sinus headache? What causes it? How can I treat it? °A: When a cavity in the bones of the face and skull (a sinus) becomes inflamed, the inflammation will cause localized pain. This condition is usually the result of an allergic reaction, a tumor, or an infection. If your headache is caused by a sinus blockage, such as an infection, you will probably have a fever. An x-ray will confirm a sinus blockage. Your caregiver's treatment might include antibiotics for the infection, as well as antihistamines or decongestants.  °REBOUND HEADACHES °Q: What is a rebound headache? What causes it? How can I treat it? °A: A pattern of taking acute headache medications too often can lead to a condition known as "rebound headache."   A pattern of taking too much headache medication includes taking it more than 2 days per week or in excessive amounts. That means more than the label or a caregiver advises. With rebound  headaches, your medications not only stop relieving pain, they actually begin to cause headaches. Doctors treat rebound headache by tapering the medication that is being overused. Sometimes your caregiver will gradually substitute a different type of treatment or medication. Stopping may be a challenge. Regularly overusing a medication increases the potential for serious side effects. Consult a caregiver if you regularly use headache medications more than 2 days per week or more than the label advises. °ADDITIONAL QUESTIONS AND ANSWERS °Q: What is biofeedback? °A: Biofeedback is a self-help treatment. Biofeedback uses special equipment to monitor your body's involuntary physical responses. Biofeedback monitors: °· Breathing. °· Pulse. °· Heart rate. °· Temperature. °· Muscle tension. °· Brain activity. °Biofeedback helps you refine and perfect your relaxation exercises. You learn to control the physical responses that are related to stress. Once the technique has been mastered, you do not need the equipment any more. °Q: Are headaches hereditary? °A: Four out of five (80%) of people that suffer report a family history of migraine. Scientists are not sure if this is genetic or a family predisposition. Despite the uncertainty, a child has a 50% chance of having migraine if one parent suffers. The child has a 75% chance if both parents suffer.  °Q: Can children get headaches? °A: By the time they reach high school, most young people have experienced some type of headache. Many safe and effective approaches or medications can prevent a headache from occurring or stop it after it has begun.  °Q: What type of doctor should I see to diagnose and treat my headache? °A: Start with your primary caregiver. Discuss his or her experience and approach to headaches. Discuss methods of classification, diagnosis, and treatment. Your caregiver may decide to recommend you to a headache specialist, depending upon your symptoms or other  physical conditions. Having diabetes, allergies, etc., may require a more comprehensive and inclusive approach to your headache. The National Headache Foundation will provide, upon request, a list of NHF physician members in your state. °Document Released: 04/10/2003 Document Revised: 04/12/2011 Document Reviewed: 09/18/2007 °ExitCare® Patient Information ©2015 [employer]. This information is not intended to replace advice given to you by your health care provider. Make sure you discuss any questions you have with your health care provider. ° °General Headache Without Cause °A headache is pain or discomfort felt around the head or neck area. The specific cause of a headache may not be found. There are many causes and types of headaches. A few common ones are: °· Tension headaches. °· Migraine headaches. °· Cluster headaches. °· Chronic daily headaches. °HOME CARE INSTRUCTIONS  °· Keep all follow-up appointments with your caregiver or any specialist referral. °· Only take over-the-counter or prescription medicines for pain or discomfort as directed by your caregiver. °· Lie down in a dark, quiet room when you have a headache. °· Keep a headache journal to find out what may trigger your migraine headaches. For example, write down: °¨ What you eat and drink. °¨ How much sleep you get. °¨ Any change to your diet or medicines. °· Try massage or other relaxation techniques. °· Put ice packs or heat on the head and neck. Use these 3 to 4 times per day for 15 to 20 minutes each time, or as needed. °· Limit stress. °· Sit up   straight, and do not tense your muscles. °· Quit smoking if you smoke. °· Limit alcohol use. °· Decrease the amount of caffeine you drink, or stop drinking caffeine. °· Eat and sleep on a regular schedule. °· Get 7 to 9 hours of sleep, or as recommended by your caregiver. °· Keep lights dim if bright lights bother you and make your headaches worse. °SEEK MEDICAL CARE IF:  °· You have problems with the  medicines you were prescribed. °· Your medicines are not working. °· You have a change from the usual headache. °· You have nausea or vomiting. °SEEK IMMEDIATE MEDICAL CARE IF:  °· Your headache becomes severe. °· You have a fever. °· You have a stiff neck. °· You have loss of vision. °· You have muscular weakness or loss of muscle control. °· You start losing your balance or have trouble walking. °· You feel faint or pass out. °· You have severe symptoms that are different from your first symptoms. °MAKE SURE YOU:  °· Understand these instructions. °· Will watch your condition. °· Will get help right away if you are not doing well or get worse. °Document Released: 01/18/2005 Document Revised: 04/12/2011 Document Reviewed: 02/03/2011 °ExitCare® Patient Information ©2015 [employer]. This information is not intended to replace advice given to you by your health care provider. Make sure you discuss any questions you have with your health care provider. ° °

## 2014-09-03 ENCOUNTER — Ambulatory Visit (INDEPENDENT_AMBULATORY_CARE_PROVIDER_SITE_OTHER): Payer: 59 | Admitting: Diagnostic Neuroimaging

## 2014-09-03 ENCOUNTER — Encounter: Payer: Self-pay | Admitting: Diagnostic Neuroimaging

## 2014-09-03 VITALS — BP 114/80 | HR 60 | Ht 69.5 in | Wt 195.0 lb

## 2014-09-03 DIAGNOSIS — R413 Other amnesia: Secondary | ICD-10-CM | POA: Diagnosis not present

## 2014-09-03 DIAGNOSIS — F4310 Post-traumatic stress disorder, unspecified: Secondary | ICD-10-CM

## 2014-09-03 DIAGNOSIS — G44209 Tension-type headache, unspecified, not intractable: Secondary | ICD-10-CM

## 2014-09-03 DIAGNOSIS — F1021 Alcohol dependence, in remission: Secondary | ICD-10-CM

## 2014-09-03 DIAGNOSIS — F1010 Alcohol abuse, uncomplicated: Secondary | ICD-10-CM

## 2014-09-03 DIAGNOSIS — F1011 Alcohol abuse, in remission: Secondary | ICD-10-CM

## 2014-09-03 HISTORY — DX: Alcohol dependence, in remission: F10.21

## 2014-09-03 NOTE — Progress Notes (Signed)
[facility]  PATIENT: Randy Villa DOB: 1980-06-07  REFERRING CLINICIAN: ER and PCP Jerelyn Scott HISTORY FROM: patient and wife  REASON FOR VISIT: new consult    HISTORICAL  CHIEF COMPLAINT:  Chief Complaint  Patient presents with  . Headache    rm 7, wife - Jimmey Ralph, "3 blackouts"    HISTORY OF PRESENT ILLNESS:   34 year old right-handed male here for evaluation of headache, like out spells, memory loss, vision changes.  Patient has history of PTSD, possibly leading to chronic alcohol abuse. Patient was drinking heavily until May 2016 when he decided he wanted to quit. He will went into inpatient rehabilitation in [place] for alcohol cessation. He was in rehabilitation for May 20 until June 15. After coming home, patient stayed out of work until July 4. After starting work, he started to have dull nagging headaches, ranging 2-7 out of 10 in severity. Had exposure throughout the day. No specific nausea or vomiting photophobia or phonophobia. He has had a migraine and tension headaches in the past. These do not feel like migraine headaches per patient. Patient also having short-term memory loss, intermittent double vision, racing thoughts, insomnia, confusional episodes. He is not able to sleep at nighttime, but is able to complete his work during the day. 3 times in the past few weeks, when he is at home, getting ready for bed, he has "gone unresponsive" according to the patient's wife. Apparently he may be sitting up in bed, then closes eyes, and then not respond in spite of vigorous sternal rub by patient's wife. No convulsions, tongue biting, incontinence. Patient will then wake up and then asked wife what happened and why she was concerned.  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 He is currently on "light duty" due to a hand injury.  Patient has not yet established with psychiatry or psychology. Apparently he is on a 6 month waiting list for a  local psychiatrist.   REVIEW OF SYSTEMS: Full 14 system review of systems performed and notable only for fatigue double vision constipation easy bruising feeling hot feeling increased thirst memory loss confusion headache numbness distal to swallowing dizziness seizure passing out restless legs insomnia sleepiness depression anxiety not asleep decreased energy racing thoughts.  ALLERGIES: No Known Allergies  HOME MEDICATIONS: Outpatient Prescriptions Prior to Visit  Medication Sig Dispense Refill  . naltrexone (DEPADE) 50 MG tablet Take 25 mg by mouth daily.    . sertraline (ZOLOFT) 100 MG tablet Take 100 mg by mouth daily.    Marland Kitchen zolpidem (AMBIEN) 10 MG tablet Take 10 mg by mouth at bedtime as needed for sleep.    Marland Kitchen omeprazole (PRILOSEC) 20 MG capsule Take 1 capsule (20 mg total) by mouth 2 (two) times daily as needed. 60 capsule 0  . diclofenac (VOLTAREN) 75 MG EC tablet TAKE ONE TAB BID FOR 7 DAYS THEN PRN 40 tablet 0  . topiramate (TOPAMAX) 25 MG tablet Take 25 mg by mouth 2 (two) times daily.     No facility-administered medications prior to visit.    PAST MEDICAL HISTORY: Past Medical History  Diagnosis Date  . Anxiety   . Seizures   . Depression   . PTSD (post-traumatic stress disorder)   . Alcoholism in recovery 09/03/2014    PAST SURGICAL HISTORY: Past Surgical History  Procedure Laterality Date  . Gastric bypass  2011    FAMILY HISTORY: Family History  Problem Relation Age of Onset  . Adopted: Yes  SOCIAL HISTORY:  History   Social History  . Marital Status: Married    Spouse Name: Jimmey Ralph  . Number of Children: 1  . Years of Education: 13   Occupational History  . [employer]   Social History Main Topics  . Smoking status: Never Smoker   . Smokeless tobacco: Current User    Types: Snuff, Chew     Comment: daily, vapor  . Alcohol Use: No     Comment: rarely, sober 06/2014 90 days  . Drug Use: No  . Sexual Activity: Not on file     Other Topics Concern  . Not on file   Social History Narrative   Going through divorce   Works off duty as security-60h/wk   Wife not Lexicographer in criminal justice   Caffeine use - 4-5 tea, coffee     PHYSICAL EXAM  GENERAL EXAM/CONSTITUTIONAL: Vitals:  Filed Vitals:   09/03/14 1025  BP: 114/80  Pulse: 60  Height: 5' 9.5" (1.765 m)  Weight: 195 lb (88.451 kg)     Body mass index is 28.39 kg/(m^2).  Visual Acuity Screening   Right eye Left eye Both eyes  Without correction: 20/30 20/40   With correction:        Patient is in no distress; well developed, nourished and groomed; neck is supple  CARDIOVASCULAR:  Examination of carotid arteries is normal; no carotid bruits  Regular rate and rhythm, no murmurs  Examination of peripheral vascular system by observation and palpation is normal  EYES:  Ophthalmoscopic exam of optic discs and posterior segments is normal; no papilledema or hemorrhages  MUSCULOSKELETAL:  Gait, strength, tone, movements noted in Neurologic exam below  NEUROLOGIC: MENTAL STATUS:  No flowsheet data found.  awake, alert, oriented to person, place and time  recent and remote memory intact  normal attention and concentration  language fluent, comprehension intact, naming intact,   fund of knowledge appropriate  CRANIAL NERVE:   2nd - no papilledema on fundoscopic exam  2nd, 3rd, 4th, 6th - pupils equal and reactive to light, visual fields full to confrontation, extraocular muscles intact, no nystagmus  5th - facial sensation symmetric  7th - facial strength symmetric  8th - hearing intact  9th - palate elevates symmetrically, uvula midline  11th - shoulder shrug symmetric  12th - tongue protrusion midline  MOTOR:   normal bulk and tone, MILD POSTURAL TREMOR; full strength in the BUE, BLE  SENSORY:   normal and symmetric to light touch, pinprick, temperature, vibration  COORDINATION:    finger-nose-finger, fine finger movements normal  REFLEXES:   deep tendon reflexes present and symmetric  GAIT/STATION:   narrow based gait; able to walk tandem; romberg is negative    DIAGNOSTIC DATA (LABS, IMAGING, TESTING) - I reviewed patient records, labs, notes, testing and imaging myself where available.  Lab Results  Component Value Date   WBC 4.2 08/28/2014   HGB 13.9 08/28/2014   HCT 40.3 08/28/2014   MCV 81.4 08/28/2014   PLT 176 08/28/2014      Component Value Date/Time   NA 138 08/28/2014 2115   K 4.0 08/28/2014 2115   CL 111 08/28/2014 2115   CO2 24 08/28/2014 2115   GLUCOSE 84 08/28/2014 2115   BUN 16 08/28/2014 2115   CREATININE 0.92 08/28/2014 2115   CALCIUM 8.6* 08/28/2014 2115   PROT 6.2 12/11/2012 2210   ALBUMIN 3.6 12/11/2012 2210   AST 25 12/11/2012 2210   ALT  17 12/11/2012 2210   ALKPHOS 94 12/11/2012 2210   BILITOT 0.4 12/11/2012 2210   GFRNONAA >60 08/28/2014 2115   GFRAA >60 08/28/2014 2115   Lab Results  Component Value Date   CHOL 158 05/19/2009   HDL 31.40* 05/19/2009   LDLCALC 108* 05/19/2009   TRIG 92.0 05/19/2009   CHOLHDL 5 05/19/2009   No results found for: HGBA1C No results found for: VITAMINB12 Lab Results  Component Value Date   TSH 1.99 05/19/2009    08/28/14 CT head - No acute intracranial abnormalities. Normal brain. [I reviewed images myself and agree with interpretation. -VRP]      ASSESSMENT AND PLAN  34 y.o. year old male here with history of PTSD, alcohol abuse currently in remission, abstinent since May 2016, now with daily tension, dull headaches, memory loss, anxiety, racing thoughts, insomnia, and intermittent episodes of unresponsiveness. Most likely these represent transition and sequelae of chronic alcohol abuse and transition to being abstinent. I do not think episodes of unresponsiveness were seizure, but will check EEG to make sure there are no epileptiform discharges. I am concerned about his  symptoms and his ability to work, and advise him to follow-up with his supervisors, medical doctor, psychiatrist for further evaluation regarding these issues. Unresponsive episodes have only occurred late at night at home and has not affected his ability to work.  PLAN: - check EEG - psychiatry referral placed to help expedite follow up  Orders Placed This Encounter  Procedures  . Ambulatory referral to Psychiatry  . EEG adult   Return in about 6 months (around 03/06/2015).    Suanne Marker, MD 09/03/2014, 5:33 PM Certified in Neurology, Neurophysiology and Neuroimaging  [facility] [address] [place], [place] [address] [phone]

## 2014-09-10 ENCOUNTER — Other Ambulatory Visit: Payer: 59

## 2015-03-13 ENCOUNTER — Ambulatory Visit: Payer: 59 | Admitting: Diagnostic Neuroimaging

## 2015-03-14 ENCOUNTER — Encounter: Payer: Self-pay | Admitting: Diagnostic Neuroimaging

## 2015-05-13 IMAGING — CR DG HAND COMPLETE 3+V*R*
3 series · 3 of 3 positions shown · non-contrast
Comparison: Prior radiographs of the right hand 05/20/2014

CLINICAL DATA: 33-year-old male with persistent right hand pain
after falling and injuring his fourth and fifth digits 9 days ago.

EXAM:
RIGHT HAND - COMPLETE 3+ VIEW

[x hand pa right]
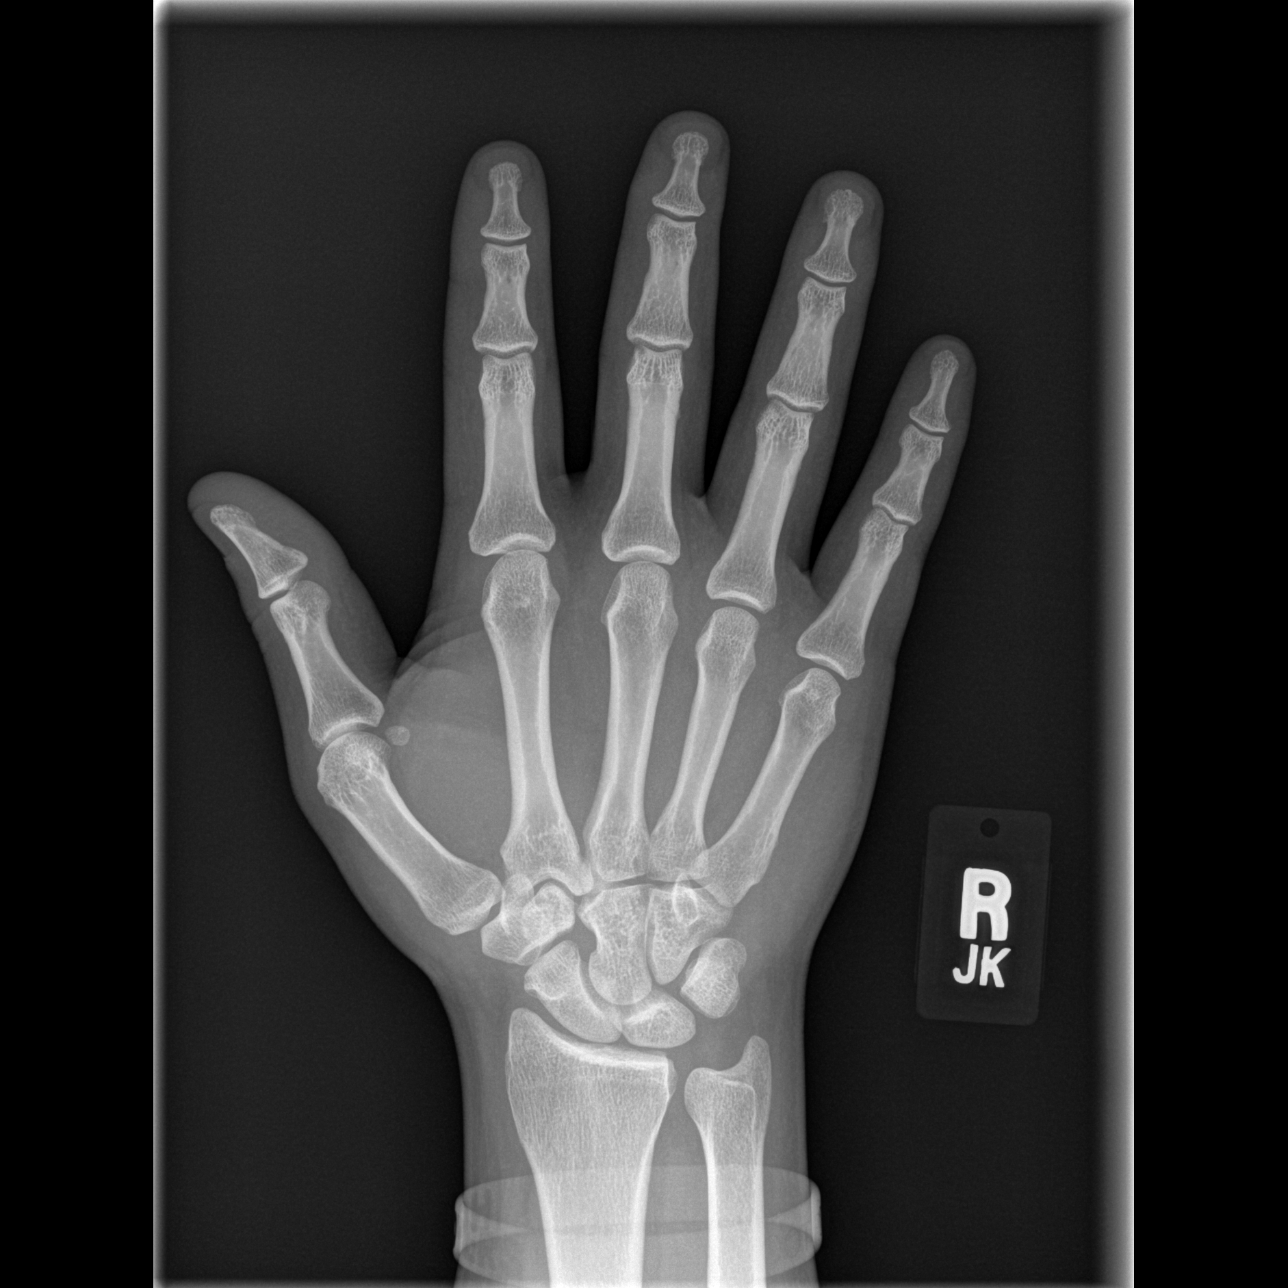

[x hand oblique right]
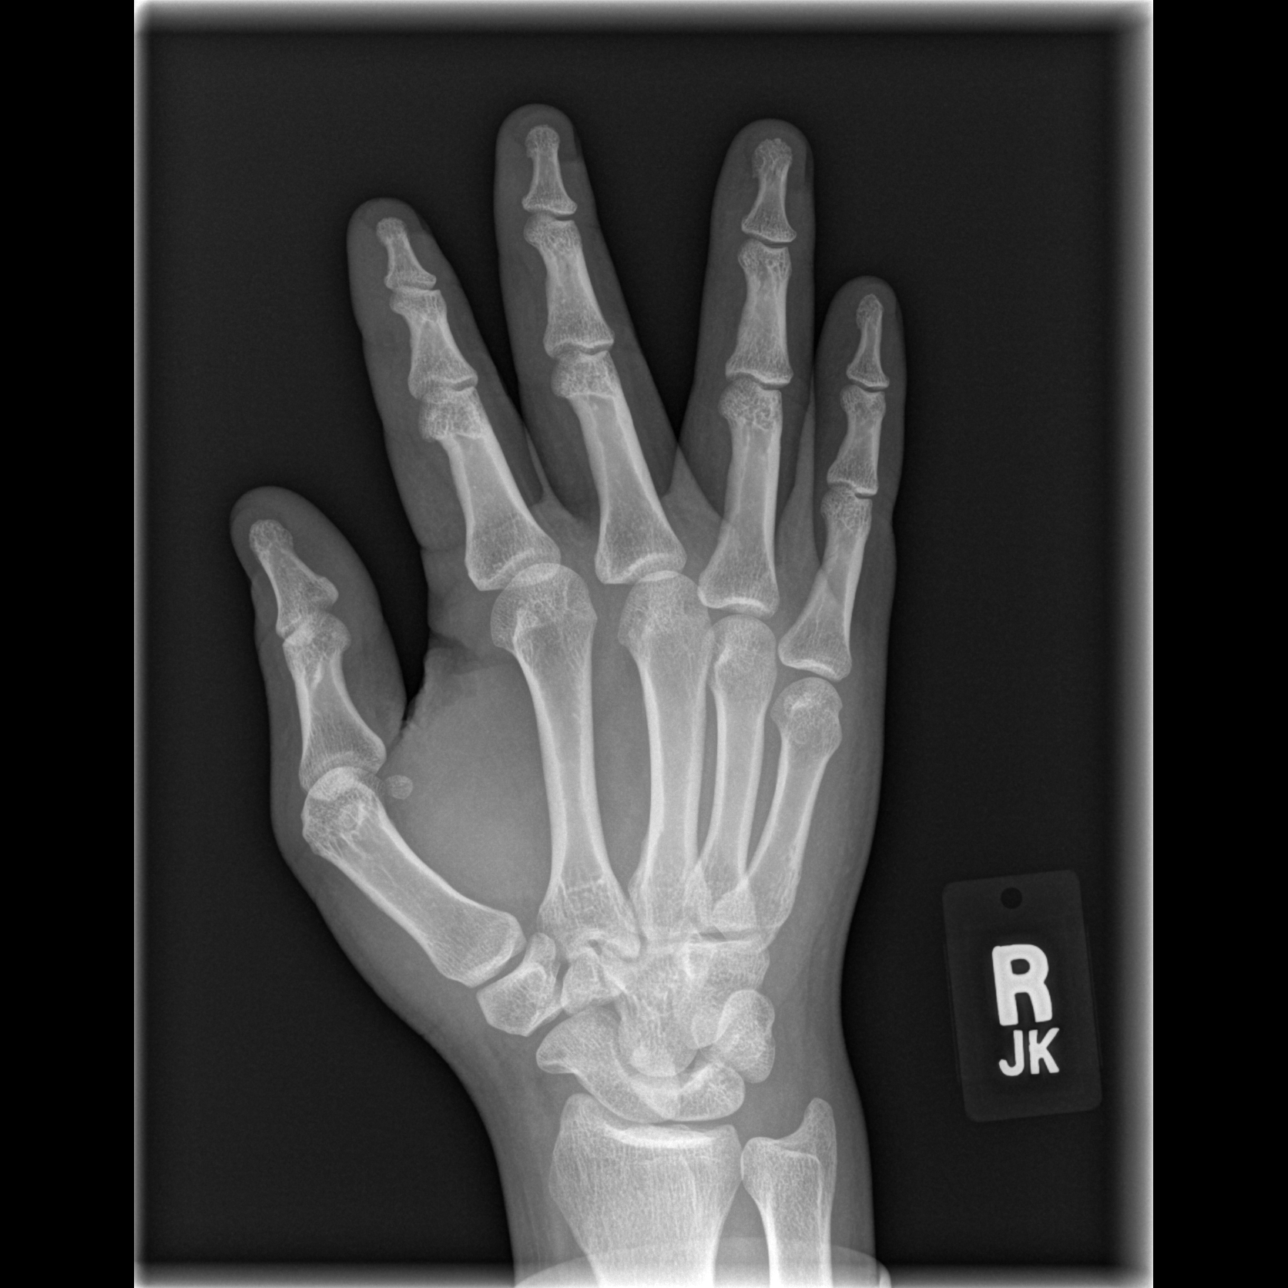

[x hand lat right]
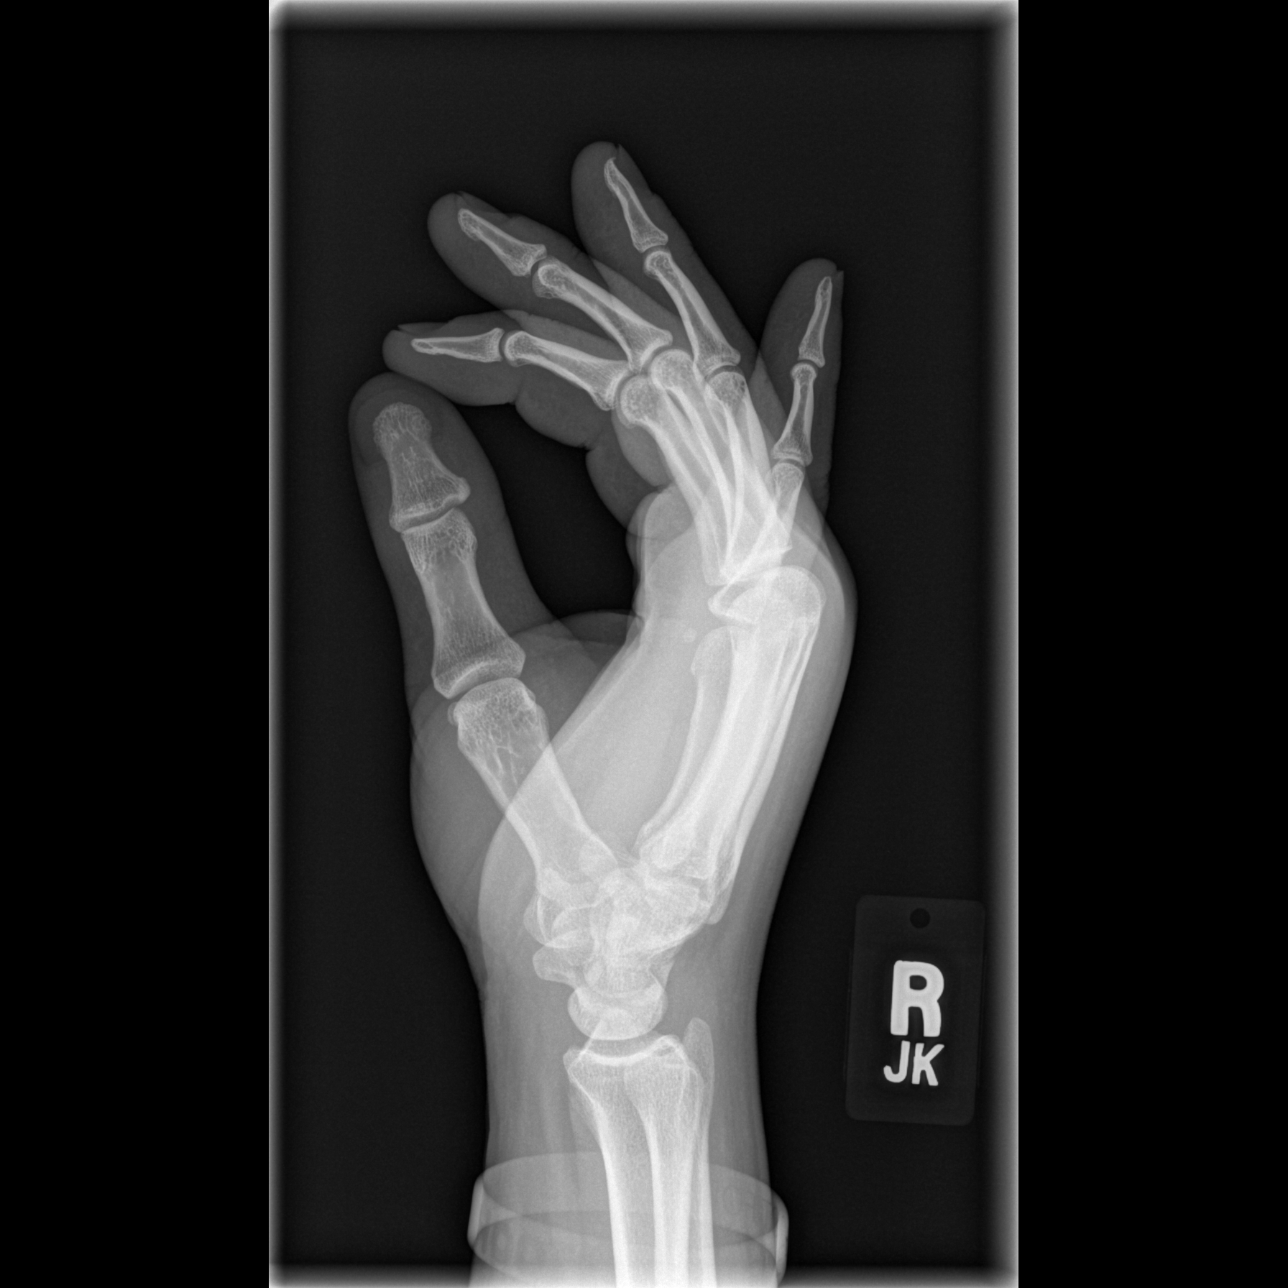

[3 of 3 positions shown; findings below may reference images not displayed]

FINDINGS: There is no evidence of fracture or dislocation. There is no
evidence of arthropathy or other focal bone abnormality. Soft
tissues are unremarkable.
IMPRESSION: Negative.

## 2015-07-04 ENCOUNTER — Other Ambulatory Visit: Payer: Self-pay | Admitting: *Deleted

## 2015-07-04 DIAGNOSIS — R200 Anesthesia of skin: Secondary | ICD-10-CM

## 2015-07-08 ENCOUNTER — Ambulatory Visit (INDEPENDENT_AMBULATORY_CARE_PROVIDER_SITE_OTHER): Payer: BLUE CROSS/BLUE SHIELD | Admitting: Neurology

## 2015-07-08 DIAGNOSIS — R200 Anesthesia of skin: Secondary | ICD-10-CM | POA: Diagnosis not present

## 2015-07-08 DIAGNOSIS — G5603 Carpal tunnel syndrome, bilateral upper limbs: Secondary | ICD-10-CM

## 2015-07-08 NOTE — Procedures (Signed)
[facility]  [address] [place], [address]  [place], [place] [address] Tel: [phone] Fax:  [phone] Test Date:  07/08/2015  Patient: Randy Villa DOB: 04-08-1980 Physician: Nita Sickle, DO  Sex: Male Height:  Ref Phys: Dr Donette Larry  ID#: [id number] Temp: 32.5C Technician: Judie Petit. Dean   Patient Complaints: This is a 34 year old gentleman referred for evaluation of bilateral hand paresthesias, worse on the right.  NCV & EMG Findings: Extensive electrodiagnostic testing of the right upper extremity and additional studies of the left shows: 1. Right median sensory response shows prolonged distal peak latency (4.1 ms) and normal amplitude. Left palmar sensory responses abnormal. Bilateral ulnar and left median sensory responses are within normal limits. 2. Right median motor response shows prolonged distal onset latency (4.5 ms) and reduced amplitude (5.6 mV).  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 Bilateral ulnar and left median motor responses are within normal limits. 3. 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 These findings are not present on the left side.  Impression: 1. Right median neuropathy at or distal to the wrist, consistent with the clinical diagnosis of carpal tunnel syndrome; moderate in degree electrically. 2. Left median neuropathy at or distal to the wrist, consistent with the clinical diagnosis of carpal tunnel syndrome; very mild in degree electrically. 3. 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   ___________________________ Nita Sickle, DO    Nerve Conduction Studies Anti Sensory Summary Table   Site NR Peak (ms) Norm Peak (ms) P-T Amp (V) Norm P-T Amp  Left Median Anti  Sensory (2nd Digit)  32.5C  Wrist    3.2 <3.4 29.6 >20  Right Median Anti Sensory (2nd Digit)  32.5C  Wrist    4.1 <3.4 21.0 >20  Left Ulnar Anti Sensory (5th Digit)  32.5C  Wrist    3.0 <3.1 18.3 >12  Right Ulnar Anti Sensory (5th Digit)  32.5C  Wrist    2.9 <3.1 22.2 >12   Motor Summary Table   Site NR Onset (ms) Norm Onset (ms) O-P Amp (mV) Norm O-P Amp Site1 Site2 Delta-0 (ms) Dist (cm) Vel (m/s) Norm Vel (m/s)  Left Median Motor (Abd Poll Brev)  32.5C  Wrist    3.8 <3.9 7.8 >6 Elbow Wrist 4.2 26.0 62 >50  Elbow    8.0  7.5         Right Median Motor (Abd Poll Brev)  32.5C  Wrist    4.5 <3.9 5.6 >6 Elbow Wrist 4.9 29.0 59 >50  Elbow    9.4  5.8  Ulnar-wrist crossover Elbow 4.8 0.0    Ulnar-wrist crossover    4.6  2.5         Left Ulnar Motor (Abd Dig Minimi)  32.5C  Wrist    2.7 <3.1 10.9 >7 B Elbow Wrist 3.7 22.0 59 >50  B Elbow    6.4  10.4  A Elbow B Elbow 1.6 10.0 63 >50  A Elbow    8.0  9.5         Right Ulnar Motor (Abd Dig Minimi)  32.5C  Wrist    2.4 <3.1 8.7 >7 B Elbow Wrist 4.0 23.0 58 >50  B Elbow    6.4  8.5  A Elbow B Elbow 1.6 10.0  63 >50  A Elbow    8.0  8.3          Comparison Summary Table   Site NR Peak (ms) Norm Peak (ms) P-T Amp (V) Site1 Site2 Delta-P (ms) Norm Delta (ms)  Left Median/Ulnar Palm Comparison (Wrist - 8cm)  32.5C  Median Palm    2.3 <2.2 40.8 Median Palm Ulnar Palm 0.3   Ulnar Palm    2.0 <2.2 10.3       EMG   Side Muscle Ins Act Fibs Psw Fasc Number Recrt Dur Dur. Amp Amp. Poly Poly. Comment  Right 1stDorInt Nml Nml Nml Nml Nml Nml Nml Nml Nml Nml Nml Nml N/A  Right Abd Poll Brev Nml Nml Nml Nml 1- Rapid Some 1+ Some 1+ Nml Nml N/A  Right Ext Indicis Nml Nml Nml Nml Nml Nml Nml Nml Nml Nml Nml Nml N/A  Right PronatorTeres Nml Nml Nml Nml Nml Nml Nml Nml Nml Nml Nml Nml N/A  Right Biceps Nml Nml Nml Nml Nml Nml Nml Nml Nml Nml Nml Nml N/A  Right Triceps Nml Nml Nml Nml Nml Nml Nml Nml Nml Nml Nml Nml N/A  Right Deltoid Nml  Nml Nml Nml Nml Nml Nml Nml Nml Nml Nml Nml N/A  Left 1stDorInt Nml Nml Nml Nml Nml Nml Nml Nml Nml Nml Nml Nml N/A  Left Abd Poll Brev Nml Nml Nml Nml Nml Nml Nml Nml Nml Nml Nml Nml N/A  Left PronatorTeres Nml Nml Nml Nml Nml Nml Nml Nml Nml Nml Nml Nml N/A      Waveforms:

## 2016-08-19 ENCOUNTER — Encounter: Payer: Self-pay | Admitting: *Deleted

## 2016-08-20 ENCOUNTER — Institutional Professional Consult (permissible substitution): Payer: Self-pay | Admitting: Diagnostic Neuroimaging

## 2016-08-23 ENCOUNTER — Encounter: Payer: Self-pay | Admitting: Diagnostic Neuroimaging

## 2016-08-23 ENCOUNTER — Encounter: Payer: Self-pay | Admitting: Student

## 2016-11-03 DIAGNOSIS — Z3141 Encounter for fertility testing: Secondary | ICD-10-CM | POA: Diagnosis not present

## 2016-12-10 DIAGNOSIS — H5203 Hypermetropia, bilateral: Secondary | ICD-10-CM | POA: Diagnosis not present

## 2018-03-25 DIAGNOSIS — J111 Influenza due to unidentified influenza virus with other respiratory manifestations: Secondary | ICD-10-CM | POA: Diagnosis not present

## 2018-09-22 ENCOUNTER — Other Ambulatory Visit: Payer: Self-pay

## 2018-09-22 DIAGNOSIS — Z20822 Contact with and (suspected) exposure to covid-19: Secondary | ICD-10-CM

## 2018-09-22 NOTE — Addendum Note (Signed)
Addended by: Maximino Cozzolino M on: 09/22/2018 04:56 PM   Modules accepted: Orders  

## 2018-09-23 LAB — NOVEL CORONAVIRUS, NAA: SARS-CoV-2, NAA: NOT DETECTED

## 2018-12-05 NOTE — Addendum Note (Signed)
Addended by: Brigitte Pulse on: 12/05/2018 01:00 PM   Modules accepted: Orders

## 2020-02-22 DIAGNOSIS — Z1152 Encounter for screening for COVID-19: Secondary | ICD-10-CM | POA: Diagnosis not present

## 2020-02-27 DIAGNOSIS — I7300 Raynaud's syndrome without gangrene: Secondary | ICD-10-CM | POA: Diagnosis not present

## 2020-02-27 DIAGNOSIS — F321 Major depressive disorder, single episode, moderate: Secondary | ICD-10-CM | POA: Diagnosis not present

## 2020-02-27 DIAGNOSIS — M79604 Pain in right leg: Secondary | ICD-10-CM | POA: Diagnosis not present

## 2020-02-27 DIAGNOSIS — F4310 Post-traumatic stress disorder, unspecified: Secondary | ICD-10-CM | POA: Diagnosis not present
